# Patient Record
Sex: Male | Born: 1970 | Race: White | Hispanic: No | Marital: Married | Smoking: Former smoker
Health system: Southern US, Community
[De-identification: ages and names within clinical notes are randomized; demographics above are authoritative.]

## PROBLEM LIST (undated history)

## (undated) DIAGNOSIS — R7303 Prediabetes: Secondary | ICD-10-CM

## (undated) HISTORY — PX: APPENDECTOMY: SHX54

---

## 2015-11-06 ENCOUNTER — Other Ambulatory Visit: Payer: Self-pay | Admitting: Oncology

## 2016-04-14 ENCOUNTER — Inpatient Hospital Stay (HOSPITAL_COMMUNITY): Payer: Medicaid Other | Admitting: Anesthesiology

## 2016-04-14 ENCOUNTER — Emergency Department (HOSPITAL_COMMUNITY): Payer: Medicaid Other

## 2016-04-14 ENCOUNTER — Encounter (HOSPITAL_COMMUNITY): Payer: Self-pay | Admitting: Emergency Medicine

## 2016-04-14 ENCOUNTER — Inpatient Hospital Stay (HOSPITAL_COMMUNITY)
Admission: EM | Admit: 2016-04-14 | Discharge: 2016-05-03 | DRG: 330 | Disposition: A | Discharge status: Home / Self Care | Payer: Medicaid Other | Attending: Emergency Medicine | Admitting: Emergency Medicine

## 2016-04-14 ENCOUNTER — Encounter (HOSPITAL_COMMUNITY): Admission: EM | Disposition: A | Discharge status: Home / Self Care | Payer: Self-pay | Source: Home / Self Care

## 2016-04-14 DIAGNOSIS — B9620 Unspecified Escherichia coli [E. coli] as the cause of diseases classified elsewhere: Secondary | ICD-10-CM | POA: Diagnosis present

## 2016-04-14 DIAGNOSIS — Z9889 Other specified postprocedural states: Secondary | ICD-10-CM

## 2016-04-14 DIAGNOSIS — K567 Ileus, unspecified: Secondary | ICD-10-CM

## 2016-04-14 DIAGNOSIS — Q430 Meckel's diverticulum (displaced) (hypertrophic): Secondary | ICD-10-CM

## 2016-04-14 DIAGNOSIS — R739 Hyperglycemia, unspecified: Secondary | ICD-10-CM

## 2016-04-14 DIAGNOSIS — E781 Pure hyperglyceridemia: Secondary | ICD-10-CM | POA: Diagnosis present

## 2016-04-14 DIAGNOSIS — T8149XA Infection following a procedure, other surgical site, initial encounter: Secondary | ICD-10-CM

## 2016-04-14 DIAGNOSIS — R110 Nausea: Secondary | ICD-10-CM | POA: Diagnosis not present

## 2016-04-14 DIAGNOSIS — R197 Diarrhea, unspecified: Secondary | ICD-10-CM | POA: Diagnosis not present

## 2016-04-14 DIAGNOSIS — K352 Acute appendicitis with generalized peritonitis, without abscess: Secondary | ICD-10-CM

## 2016-04-14 DIAGNOSIS — R140 Abdominal distension (gaseous): Secondary | ICD-10-CM

## 2016-04-14 DIAGNOSIS — K913 Postprocedural intestinal obstruction, unspecified as to partial versus complete: Secondary | ICD-10-CM | POA: Diagnosis not present

## 2016-04-14 DIAGNOSIS — Z87891 Personal history of nicotine dependence: Secondary | ICD-10-CM | POA: Diagnosis not present

## 2016-04-14 DIAGNOSIS — K35209 Acute appendicitis with generalized peritonitis, without abscess, unspecified as to perforation: Secondary | ICD-10-CM

## 2016-04-14 DIAGNOSIS — K6811 Postprocedural retroperitoneal abscess: Secondary | ICD-10-CM | POA: Diagnosis not present

## 2016-04-14 DIAGNOSIS — K3532 Acute appendicitis with perforation and localized peritonitis, without abscess: Secondary | ICD-10-CM | POA: Diagnosis present

## 2016-04-14 DIAGNOSIS — T8143XA Infection following a procedure, organ and space surgical site, initial encounter: Secondary | ICD-10-CM

## 2016-04-14 DIAGNOSIS — K56609 Unspecified intestinal obstruction, unspecified as to partial versus complete obstruction: Secondary | ICD-10-CM

## 2016-04-14 DIAGNOSIS — Z09 Encounter for follow-up examination after completed treatment for conditions other than malignant neoplasm: Secondary | ICD-10-CM

## 2016-04-14 DIAGNOSIS — R7303 Prediabetes: Secondary | ICD-10-CM | POA: Diagnosis present

## 2016-04-14 DIAGNOSIS — K353 Acute appendicitis with localized peritonitis: Principal | ICD-10-CM | POA: Diagnosis present

## 2016-04-14 DIAGNOSIS — R1031 Right lower quadrant pain: Secondary | ICD-10-CM | POA: Diagnosis present

## 2016-04-14 DIAGNOSIS — R109 Unspecified abdominal pain: Secondary | ICD-10-CM

## 2016-04-14 DIAGNOSIS — D72829 Elevated white blood cell count, unspecified: Secondary | ICD-10-CM

## 2016-04-14 DIAGNOSIS — R112 Nausea with vomiting, unspecified: Secondary | ICD-10-CM

## 2016-04-14 HISTORY — PX: LAPAROSCOPIC APPENDECTOMY: SHX408

## 2016-04-14 HISTORY — DX: Prediabetes: R73.03

## 2016-04-14 LAB — COMPREHENSIVE METABOLIC PANEL
ALK PHOS: 74 U/L (ref 38–126)
ALT: 31 U/L (ref 17–63)
ANION GAP: 9 (ref 5–15)
AST: 27 U/L (ref 15–41)
Albumin: 4.7 g/dL (ref 3.5–5.0)
BUN: 15 mg/dL (ref 6–20)
CALCIUM: 9.3 mg/dL (ref 8.9–10.3)
CHLORIDE: 100 mmol/L — AB (ref 101–111)
CO2: 24 mmol/L (ref 22–32)
Creatinine, Ser: 1.19 mg/dL (ref 0.61–1.24)
Glucose, Bld: 200 mg/dL — ABNORMAL HIGH (ref 65–99)
Potassium: 4.3 mmol/L (ref 3.5–5.1)
SODIUM: 133 mmol/L — AB (ref 135–145)
Total Bilirubin: 2.3 mg/dL — ABNORMAL HIGH (ref 0.3–1.2)
Total Protein: 8 g/dL (ref 6.5–8.1)

## 2016-04-14 LAB — CBC
HCT: 47.8 % (ref 39.0–52.0)
HEMOGLOBIN: 16.8 g/dL (ref 13.0–17.0)
MCH: 32.2 pg (ref 26.0–34.0)
MCHC: 35.1 g/dL (ref 30.0–36.0)
MCV: 91.7 fL (ref 78.0–100.0)
PLATELETS: 319 10*3/uL (ref 150–400)
RBC: 5.21 MIL/uL (ref 4.22–5.81)
RDW: 12.7 % (ref 11.5–15.5)
WBC: 21.1 10*3/uL — AB (ref 4.0–10.5)

## 2016-04-14 LAB — LIPASE, BLOOD: LIPASE: 18 U/L (ref 11–51)

## 2016-04-14 LAB — CBG MONITORING, ED: Glucose-Capillary: 169 mg/dL — ABNORMAL HIGH (ref 65–99)

## 2016-04-14 LAB — I-STAT CG4 LACTIC ACID, ED: Lactic Acid, Venous: 1.1 mmol/L (ref 0.5–1.9)

## 2016-04-14 LAB — GLUCOSE, CAPILLARY
GLUCOSE-CAPILLARY: 180 mg/dL — AB (ref 65–99)
GLUCOSE-CAPILLARY: 156 mg/dL — AB (ref 65–99)

## 2016-04-14 SURGERY — APPENDECTOMY, LAPAROSCOPIC
Anesthesia: General | Site: Abdomen

## 2016-04-14 MED ORDER — PIPERACILLIN-TAZOBACTAM 3.375 G IVPB
INTRAVENOUS | Status: AC
  Filled 2016-04-14: qty 50

## 2016-04-14 MED ORDER — MIDAZOLAM HCL 5 MG/5ML IJ SOLN
INTRAMUSCULAR | Status: DC | PRN
  Administered 2016-04-14: 2 mg via INTRAVENOUS

## 2016-04-14 MED ORDER — ENOXAPARIN SODIUM 40 MG/0.4ML ~~LOC~~ SOLN: 40.0000 mg | SUBCUTANEOUS | Status: DC

## 2016-04-14 MED ORDER — HYDROMORPHONE HCL 1 MG/ML IJ SOLN
1.0000 mg | Freq: Once | INTRAMUSCULAR | Status: AC
Start: 2016-04-14 — End: 2016-04-14
  Administered 2016-04-14: 1 mg via INTRAVENOUS
  Filled 2016-04-14: qty 1

## 2016-04-14 MED ORDER — BUPIVACAINE HCL 0.25 % IJ SOLN
INTRAMUSCULAR | Status: DC | PRN
  Administered 2016-04-14: 50 mL

## 2016-04-14 MED ORDER — BUPIVACAINE HCL (PF) 0.25 % IJ SOLN
INTRAMUSCULAR | Status: AC
  Filled 2016-04-14: qty 60

## 2016-04-14 MED ORDER — ONDANSETRON HCL 4 MG/2ML IJ SOLN
4.0000 mg | Freq: Once | INTRAMUSCULAR | Status: AC
Start: 2016-04-14 — End: 2016-04-14
  Administered 2016-04-14: 4 mg via INTRAVENOUS
  Filled 2016-04-14: qty 2

## 2016-04-14 MED ORDER — PROPOFOL 10 MG/ML IV BOLUS
INTRAVENOUS | Status: DC | PRN
  Administered 2016-04-14: 150 mg via INTRAVENOUS

## 2016-04-14 MED ORDER — HYDROMORPHONE HCL 1 MG/ML IJ SOLN
0.5000 mg | INTRAMUSCULAR | Status: DC | PRN
  Administered 2016-04-14: 1 mg via INTRAVENOUS
  Administered 2016-04-15: 2 mg via INTRAVENOUS
  Administered 2016-04-15: 1 mg via INTRAVENOUS
  Filled 2016-04-14 (×2): qty 1
  Filled 2016-04-14: qty 2

## 2016-04-14 MED ORDER — ENOXAPARIN SODIUM 40 MG/0.4ML ~~LOC~~ SOLN
40.0000 mg | SUBCUTANEOUS | Status: DC
  Administered 2016-04-15 – 2016-04-24 (×10): 40 mg via SUBCUTANEOUS
  Filled 2016-04-14 (×9): qty 0.4

## 2016-04-14 MED ORDER — HYDRALAZINE HCL 20 MG/ML IJ SOLN: 10.0000 mg | INTRAMUSCULAR | Status: DC | PRN

## 2016-04-14 MED ORDER — PHENOL 1.4 % MT LIQD
2.0000 | OROMUCOSAL | Status: DC | PRN
  Filled 2016-04-14 (×2): qty 177

## 2016-04-14 MED ORDER — PHENYLEPHRINE HCL 10 MG/ML IJ SOLN
INTRAMUSCULAR | Status: DC | PRN
  Administered 2016-04-14: 80 ug via INTRAVENOUS

## 2016-04-14 MED ORDER — ROCURONIUM BROMIDE 50 MG/5ML IV SOSY
PREFILLED_SYRINGE | INTRAVENOUS | Status: AC
Start: 2016-04-14 — End: 2016-04-14
  Filled 2016-04-14: qty 5

## 2016-04-14 MED ORDER — LACTATED RINGERS IV BOLUS (SEPSIS)
1000.0000 mL | Freq: Once | INTRAVENOUS | Status: AC
  Administered 2016-04-14: 1000 mL via INTRAVENOUS

## 2016-04-14 MED ORDER — FENTANYL CITRATE (PF) 100 MCG/2ML IJ SOLN
INTRAMUSCULAR | Status: AC
  Filled 2016-04-14: qty 2

## 2016-04-14 MED ORDER — ACETAMINOPHEN 10 MG/ML IV SOLN
INTRAVENOUS | Status: AC
  Filled 2016-04-14: qty 100

## 2016-04-14 MED ORDER — OXYCODONE HCL 5 MG PO TABS: 5.0000 mg | ORAL_TABLET | ORAL | 0 refills | Status: DC | PRN

## 2016-04-14 MED ORDER — PROMETHAZINE HCL 25 MG/ML IJ SOLN: 6.2500 mg | INTRAMUSCULAR | Status: DC | PRN

## 2016-04-14 MED ORDER — SODIUM CHLORIDE 0.9 % IV SOLN: 3.0000 g | Freq: Once | INTRAVENOUS | Status: DC

## 2016-04-14 MED ORDER — SUCCINYLCHOLINE CHLORIDE 200 MG/10ML IV SOSY
PREFILLED_SYRINGE | INTRAVENOUS | Status: AC
  Filled 2016-04-14: qty 10

## 2016-04-14 MED ORDER — SIMETHICONE 80 MG PO CHEW: 160.0000 mg | CHEWABLE_TABLET | Freq: Four times a day (QID) | ORAL | Status: DC | PRN

## 2016-04-14 MED ORDER — MENTHOL 3 MG MT LOZG: 1.0000 | LOZENGE | OROMUCOSAL | Status: DC | PRN

## 2016-04-14 MED ORDER — ROCURONIUM BROMIDE 50 MG/5ML IV SOSY
PREFILLED_SYRINGE | INTRAVENOUS | Status: DC | PRN
Start: 2016-04-14 — End: 2016-04-14
  Administered 2016-04-14: 30 mg via INTRAVENOUS
  Administered 2016-04-14 (×6): 10 mg via INTRAVENOUS

## 2016-04-14 MED ORDER — SODIUM CHLORIDE 0.9 % IV BOLUS (SEPSIS)
1000.0000 mL | Freq: Once | INTRAVENOUS | Status: AC
  Administered 2016-04-14: 1000 mL via INTRAVENOUS

## 2016-04-14 MED ORDER — ALUM & MAG HYDROXIDE-SIMETH 200-200-20 MG/5ML PO SUSP
30.0000 mL | Freq: Four times a day (QID) | ORAL | Status: DC | PRN
  Administered 2016-04-21 – 2016-04-29 (×5): 30 mL via ORAL
  Filled 2016-04-14 (×6): qty 30

## 2016-04-14 MED ORDER — ONDANSETRON 4 MG PO TBDP: 4.0000 mg | ORAL_TABLET | Freq: Four times a day (QID) | ORAL | Status: DC | PRN

## 2016-04-14 MED ORDER — HYDROMORPHONE HCL 1 MG/ML IJ SOLN: 0.2500 mg | INTRAMUSCULAR | Status: DC | PRN

## 2016-04-14 MED ORDER — IOPAMIDOL (ISOVUE-300) INJECTION 61%
30.0000 mL | Freq: Once | INTRAVENOUS | Status: AC | PRN
  Administered 2016-04-14: 30 mL via ORAL

## 2016-04-14 MED ORDER — BUPIVACAINE LIPOSOME 1.3 % IJ SUSP
20.0000 mL | Freq: Once | INTRAMUSCULAR | Status: AC
  Administered 2016-04-14: 20 mL
  Filled 2016-04-14: qty 20

## 2016-04-14 MED ORDER — PROCHLORPERAZINE EDISYLATE 5 MG/ML IJ SOLN
5.0000 mg | INTRAMUSCULAR | Status: DC | PRN
  Administered 2016-04-18: 10 mg via INTRAVENOUS
  Administered 2016-04-19 – 2016-04-29 (×2): 5 mg via INTRAVENOUS
  Filled 2016-04-14 (×3): qty 2

## 2016-04-14 MED ORDER — SUGAMMADEX SODIUM 200 MG/2ML IV SOLN
INTRAVENOUS | Status: AC
  Filled 2016-04-14: qty 2

## 2016-04-14 MED ORDER — INSULIN ASPART 100 UNIT/ML ~~LOC~~ SOLN
0.0000 [IU] | SUBCUTANEOUS | Status: DC
Start: 2016-04-14 — End: 2016-04-16
  Administered 2016-04-15: 2 [IU] via SUBCUTANEOUS
  Administered 2016-04-15 (×2): 3 [IU] via SUBCUTANEOUS
  Administered 2016-04-15 (×2): 2 [IU] via SUBCUTANEOUS
  Administered 2016-04-15: 3 [IU] via SUBCUTANEOUS
  Administered 2016-04-16 (×3): 2 [IU] via SUBCUTANEOUS
  Filled 2016-04-14: qty 1

## 2016-04-14 MED ORDER — IOPAMIDOL (ISOVUE-300) INJECTION 61%
100.0000 mL | Freq: Once | INTRAVENOUS | Status: AC | PRN
  Administered 2016-04-14: 100 mL via INTRAVENOUS

## 2016-04-14 MED ORDER — LACTATED RINGERS IR SOLN
Status: DC | PRN
  Administered 2016-04-14: 12000 mL

## 2016-04-14 MED ORDER — PIPERACILLIN-TAZOBACTAM 3.375 G IVPB
3.3750 g | Freq: Three times a day (TID) | INTRAVENOUS | Status: AC
  Administered 2016-04-14 – 2016-04-19 (×14): 3.375 g via INTRAVENOUS
  Filled 2016-04-14 (×14): qty 50

## 2016-04-14 MED ORDER — SUCCINYLCHOLINE CHLORIDE 200 MG/10ML IV SOSY
PREFILLED_SYRINGE | INTRAVENOUS | Status: DC | PRN
  Administered 2016-04-14: 120 mg via INTRAVENOUS

## 2016-04-14 MED ORDER — METOCLOPRAMIDE HCL 5 MG/ML IJ SOLN: 5.0000 mg | Freq: Four times a day (QID) | INTRAMUSCULAR | Status: DC | PRN

## 2016-04-14 MED ORDER — ACETAMINOPHEN 10 MG/ML IV SOLN
1000.0000 mg | Freq: Once | INTRAVENOUS | Status: AC
  Administered 2016-04-14: 1000 mg via INTRAVENOUS

## 2016-04-14 MED ORDER — LIDOCAINE 2% (20 MG/ML) 5 ML SYRINGE
INTRAMUSCULAR | Status: AC
  Filled 2016-04-14: qty 5

## 2016-04-14 MED ORDER — LACTATED RINGERS IV SOLN
INTRAVENOUS | Status: DC
  Administered 2016-04-14 – 2016-04-19 (×10): via INTRAVENOUS
  Administered 2016-04-22: 85 mL/h via INTRAVENOUS

## 2016-04-14 MED ORDER — LIDOCAINE 2% (20 MG/ML) 5 ML SYRINGE
INTRAMUSCULAR | Status: DC | PRN
  Administered 2016-04-14: 80 mg via INTRAVENOUS

## 2016-04-14 MED ORDER — METOPROLOL TARTRATE 5 MG/5ML IV SOLN
5.0000 mg | Freq: Four times a day (QID) | INTRAVENOUS | Status: DC | PRN
  Administered 2016-04-23: 5 mg via INTRAVENOUS
  Filled 2016-04-14: qty 5

## 2016-04-14 MED ORDER — BISACODYL 10 MG RE SUPP: 10.0000 mg | Freq: Two times a day (BID) | RECTAL | Status: DC | PRN

## 2016-04-14 MED ORDER — HYDROMORPHONE HCL 1 MG/ML IJ SOLN
1.0000 mg | Freq: Once | INTRAMUSCULAR | Status: AC
  Administered 2016-04-14: 1 mg via INTRAVENOUS
  Filled 2016-04-14: qty 1

## 2016-04-14 MED ORDER — PIPERACILLIN-TAZOBACTAM 3.375 G IVPB
3.3750 g | Freq: Once | INTRAVENOUS | Status: AC
  Administered 2016-04-14: 3.375 g via INTRAVENOUS
  Filled 2016-04-14: qty 50

## 2016-04-14 MED ORDER — MAGIC MOUTHWASH
15.0000 mL | Freq: Four times a day (QID) | ORAL | Status: DC | PRN
  Filled 2016-04-14: qty 15

## 2016-04-14 MED ORDER — ACETAMINOPHEN 325 MG PO TABS: 650.0000 mg | ORAL_TABLET | Freq: Four times a day (QID) | ORAL | Status: DC | PRN

## 2016-04-14 MED ORDER — SODIUM CHLORIDE 0.9 % IV SOLN
INTRAVENOUS | Status: DC
  Filled 2016-04-14: qty 6

## 2016-04-14 MED ORDER — PROPOFOL 10 MG/ML IV BOLUS
INTRAVENOUS | Status: AC
  Filled 2016-04-14: qty 20

## 2016-04-14 MED ORDER — STERILE WATER FOR IRRIGATION IR SOLN
Status: DC | PRN
  Administered 2016-04-14: 1000 mL

## 2016-04-14 MED ORDER — DIPHENHYDRAMINE HCL 50 MG/ML IJ SOLN
12.5000 mg | Freq: Four times a day (QID) | INTRAMUSCULAR | Status: DC | PRN
  Administered 2016-04-18 – 2016-04-29 (×3): 12.5 mg via INTRAVENOUS
  Filled 2016-04-14 (×3): qty 1

## 2016-04-14 MED ORDER — SIMETHICONE 80 MG PO CHEW
80.0000 mg | CHEWABLE_TABLET | Freq: Four times a day (QID) | ORAL | Status: DC | PRN
  Administered 2016-04-26 – 2016-04-30 (×4): 80 mg via ORAL
  Filled 2016-04-14 (×5): qty 1

## 2016-04-14 MED ORDER — METHOCARBAMOL 1000 MG/10ML IJ SOLN
1000.0000 mg | Freq: Four times a day (QID) | INTRAVENOUS | Status: DC | PRN
  Filled 2016-04-14: qty 10

## 2016-04-14 MED ORDER — PSEUDOEPHEDRINE-GUAIFENESIN ER 120-1200 MG PO TB12: 1.0000 | ORAL_TABLET | Freq: Two times a day (BID) | ORAL | Status: DC | PRN

## 2016-04-14 MED ORDER — ONDANSETRON HCL 4 MG/2ML IJ SOLN
4.0000 mg | Freq: Four times a day (QID) | INTRAMUSCULAR | Status: DC | PRN
  Administered 2016-04-18 – 2016-04-23 (×3): 4 mg via INTRAVENOUS
  Filled 2016-04-14 (×3): qty 2

## 2016-04-14 MED ORDER — DIPHENHYDRAMINE HCL 12.5 MG/5ML PO ELIX: 12.5000 mg | ORAL_SOLUTION | Freq: Four times a day (QID) | ORAL | Status: DC | PRN

## 2016-04-14 MED ORDER — ONDANSETRON HCL 4 MG/2ML IJ SOLN
INTRAMUSCULAR | Status: AC
  Filled 2016-04-14: qty 2

## 2016-04-14 MED ORDER — ROCURONIUM BROMIDE 50 MG/5ML IV SOSY
PREFILLED_SYRINGE | INTRAVENOUS | Status: AC
  Filled 2016-04-14: qty 5

## 2016-04-14 MED ORDER — FENTANYL CITRATE (PF) 100 MCG/2ML IJ SOLN
INTRAMUSCULAR | Status: DC | PRN
  Administered 2016-04-14 (×4): 50 ug via INTRAVENOUS

## 2016-04-14 MED ORDER — MIDAZOLAM HCL 2 MG/2ML IJ SOLN
INTRAMUSCULAR | Status: AC
  Filled 2016-04-14: qty 2

## 2016-04-14 MED ORDER — NAPROXEN 500 MG PO TABS: 500.0000 mg | ORAL_TABLET | Freq: Two times a day (BID) | ORAL | 1 refills | Status: DC | PRN

## 2016-04-14 MED ORDER — ACETAMINOPHEN 650 MG RE SUPP
650.0000 mg | Freq: Four times a day (QID) | RECTAL | Status: DC | PRN
Start: 2016-04-14 — End: 2016-04-22

## 2016-04-14 MED ORDER — SIMETHICONE 80 MG PO CHEW: 40.0000 mg | CHEWABLE_TABLET | Freq: Four times a day (QID) | ORAL | Status: DC | PRN

## 2016-04-14 MED ORDER — ONDANSETRON HCL 4 MG/2ML IJ SOLN
INTRAMUSCULAR | Status: DC | PRN
  Administered 2016-04-14: 4 mg via INTRAVENOUS

## 2016-04-14 MED ORDER — LIP MEDEX EX OINT
1.0000 "application " | TOPICAL_OINTMENT | Freq: Two times a day (BID) | CUTANEOUS | Status: DC
  Administered 2016-04-14 – 2016-04-30 (×23): 1 via TOPICAL
  Filled 2016-04-14 (×2): qty 7

## 2016-04-14 MED ORDER — 0.9 % SODIUM CHLORIDE (POUR BTL) OPTIME
TOPICAL | Status: DC | PRN
  Administered 2016-04-14: 1000 mL

## 2016-04-14 MED ORDER — SUGAMMADEX SODIUM 200 MG/2ML IV SOLN
INTRAVENOUS | Status: DC | PRN
  Administered 2016-04-14: 200 mg via INTRAVENOUS

## 2016-04-14 SURGICAL SUPPLY — 59 items
APPLIER CLIP 5 13 M/L LIGAMAX5 (MISCELLANEOUS)
APPLIER CLIP ROT 10 11.4 M/L (STAPLE)
CABLE HIGH FREQUENCY MONO STRZ (ELECTRODE) ×3 IMPLANT
CHLORAPREP W/TINT 26ML (MISCELLANEOUS) ×3 IMPLANT
CLIP APPLIE 5 13 M/L LIGAMAX5 (MISCELLANEOUS) IMPLANT
CLIP APPLIE ROT 10 11.4 M/L (STAPLE) IMPLANT
COVER SURGICAL LIGHT HANDLE (MISCELLANEOUS) ×3 IMPLANT
CUTTER FLEX LINEAR 45M (STAPLE) IMPLANT
DECANTER SPIKE VIAL GLASS SM (MISCELLANEOUS) ×3 IMPLANT
DEVICE TROCAR PUNCTURE CLOSURE (ENDOMECHANICALS) IMPLANT
DRAPE LAPAROSCOPIC ABDOMINAL (DRAPES) ×3 IMPLANT
DRAPE WARM FLUID 44X44 (DRAPE) ×3 IMPLANT
DRSG TEGADERM 2-3/8X2-3/4 SM (GAUZE/BANDAGES/DRESSINGS) ×15 IMPLANT
DRSG TEGADERM 4X4.75 (GAUZE/BANDAGES/DRESSINGS) ×3 IMPLANT
DRSG VAC ATS SM SENSATRAC (GAUZE/BANDAGES/DRESSINGS) ×3 IMPLANT
ELECT REM PT RETURN 9FT ADLT (ELECTROSURGICAL) ×3
ELECTRODE REM PT RTRN 9FT ADLT (ELECTROSURGICAL) ×1 IMPLANT
ENDOLOOP SUT PDS II  0 18 (SUTURE)
ENDOLOOP SUT PDS II 0 18 (SUTURE) IMPLANT
GAUZE SPONGE 2X2 8PLY STRL LF (GAUZE/BANDAGES/DRESSINGS) ×2 IMPLANT
GLOVE ECLIPSE 8.0 STRL XLNG CF (GLOVE) ×3 IMPLANT
GLOVE INDICATOR 8.0 STRL GRN (GLOVE) ×3 IMPLANT
GOWN STRL REUS W/TWL XL LVL3 (GOWN DISPOSABLE) ×6 IMPLANT
HANDLE SUCTION POOLE (INSTRUMENTS) ×1 IMPLANT
IRRIG SUCT STRYKERFLOW 2 WTIP (MISCELLANEOUS) ×3
IRRIGATION SUCT STRKRFLW 2 WTP (MISCELLANEOUS) ×1 IMPLANT
KIT BASIN OR (CUSTOM PROCEDURE TRAY) ×3 IMPLANT
PAD POSITIONING PINK XL (MISCELLANEOUS) ×3 IMPLANT
PENCIL BUTTON HOLSTER BLD 10FT (ELECTRODE) ×3 IMPLANT
POUCH SPECIMEN RETRIEVAL 10MM (ENDOMECHANICALS) ×3 IMPLANT
RELOAD 45 VASCULAR/THIN (ENDOMECHANICALS) IMPLANT
RELOAD STAPLE TA45 3.5 REG BLU (ENDOMECHANICALS) IMPLANT
SCISSORS LAP 5X35 DISP (ENDOMECHANICALS) ×3 IMPLANT
SHEARS HARMONIC ACE PLUS 36CM (ENDOMECHANICALS) IMPLANT
SLEEVE XCEL OPT CAN 5 100 (ENDOMECHANICALS) ×6 IMPLANT
SPONGE GAUZE 2X2 STER 10/PKG (GAUZE/BANDAGES/DRESSINGS) ×4
STAPLER 90 3.5 STAND SLIM (STAPLE) ×3
STAPLER 90 3.5 STD SLIM (STAPLE) ×1 IMPLANT
STAPLER GUN LINEAR PROX 60 (STAPLE) ×3 IMPLANT
STAPLER PROXIMATE 75MM BLUE (STAPLE) ×3 IMPLANT
SUCTION POOLE HANDLE (INSTRUMENTS) ×3
SUT MNCRL AB 4-0 PS2 18 (SUTURE) ×3 IMPLANT
SUT PDS AB 0 CT1 36 (SUTURE) IMPLANT
SUT PDS AB 1 CT1 27 (SUTURE) IMPLANT
SUT PDS AB 1 CTX 36 (SUTURE) ×6 IMPLANT
SUT SILK 0 (SUTURE) ×2
SUT SILK 0 30XBRD TIE 6 (SUTURE) ×1 IMPLANT
SUT SILK 2 0 SH (SUTURE) IMPLANT
SUT SILK 2 0 SH CR/8 (SUTURE) ×3 IMPLANT
SUT SILK 3 0 SH CR/8 (SUTURE) ×3 IMPLANT
SYS LAPSCP GELPORT 120MM (MISCELLANEOUS) ×3
SYSTEM LAPSCP GELPORT 120MM (MISCELLANEOUS) ×1 IMPLANT
TOWEL OR 17X26 10 PK STRL BLUE (TOWEL DISPOSABLE) ×3 IMPLANT
TRAY FOLEY W/METER SILVER 16FR (SET/KITS/TRAYS/PACK) IMPLANT
TRAY LAPAROSCOPIC (CUSTOM PROCEDURE TRAY) ×3 IMPLANT
TROCAR BLADELESS OPT 5 100 (ENDOMECHANICALS) ×3 IMPLANT
TROCAR XCEL 12X100 BLDLESS (ENDOMECHANICALS) ×3 IMPLANT
TUBING INSUF HEATED (TUBING) ×3 IMPLANT
YANKAUER SUCT BULB TIP 10FT TU (MISCELLANEOUS) ×3 IMPLANT

## 2016-04-14 NOTE — ED Provider Notes (Signed)
Patient care is assumed from Dr. Effie ShyWentz at shift change. I have examined the patient. He is alert with clear mental status and no respiratory distress. Abdominal pain is concentrated to right lower quadrant. At this time, he does not have diffuse peritoneal signs. Heart rate is 107. Blood pressures are stable. Patient denies any need for additional pain medication at this time.  Consult: Dr. Michaell CowingGross. Case is reviewed at 15:30. He will see the patient for a call evaluation.  I have changed the patient's antibiotic from Unasyn to Zosyn as it had not yet been administered at 15:31.    Arby BarretteMarcy Lakyla Biswas, MD 04/14/16 [phone]

## 2016-04-14 NOTE — ED Notes (Signed)
Patient reports that he is not a diabetic and would like to hold off on the insulin. States that illness could be causing the increase in sugar level.

## 2016-04-14 NOTE — Anesthesia Preprocedure Evaluation (Signed)
Anesthesia Evaluation  Patient identified by MRN, date of birth, ID band Patient awake    Reviewed: Allergy & Precautions, NPO status , Patient's Chart, lab work & pertinent test results  Airway Mallampati: II  TM Distance: >3 FB Neck ROM: Full    Dental  (+) Dental Advisory Given   Pulmonary former smoker,    breath sounds clear to auscultation       Cardiovascular (-) hypertension(-) angina Rhythm:Regular Rate:Normal     Neuro/Psych neg Seizures    GI/Hepatic Neg liver ROS, Acute appendicitis   Endo/Other  negative endocrine ROS  Renal/GU negative Renal ROS     Musculoskeletal   Abdominal   Peds  Hematology negative hematology ROS (+)   Anesthesia Other Findings   Reproductive/Obstetrics                             Lab Results  Component Value Date   WBC 21.1 (H) 04/14/2016   HGB 16.8 04/14/2016   HCT 47.8 04/14/2016   MCV 91.7 04/14/2016   PLT 319 04/14/2016   Lab Results  Component Value Date   CREATININE 1.19 04/14/2016   BUN 15 04/14/2016   NA 133 (L) 04/14/2016   K 4.3 04/14/2016   CL 100 (L) 04/14/2016   CO2 24 04/14/2016    Anesthesia Physical Anesthesia Plan  ASA: II and emergent  Anesthesia Plan: General   Post-op Pain Management:    Induction: Intravenous and Rapid sequence  Airway Management Planned: Oral ETT  Additional Equipment:   Intra-op Plan:   Post-operative Plan: Extubation in OR  Informed Consent: I have reviewed the patients History and Physical, chart, labs and discussed the procedure including the risks, benefits and alternatives for the proposed anesthesia with the patient or authorized representative who has indicated his/her understanding and acceptance.   Dental advisory given  Plan Discussed with: CRNA  Anesthesia Plan Comments:         Anesthesia Quick Evaluation

## 2016-04-14 NOTE — ED Notes (Signed)
CHG bath completed  

## 2016-04-14 NOTE — ED Notes (Signed)
Pt attempting to provide urine specimen

## 2016-04-14 NOTE — H&P (Signed)
[facility]  [address] [place], [place] [address] Phone: [phone] FAX: [facility]  Oct 17, 1970 [id number]  CARE TEAM:  PCP: No PCP Per Patient  Outpatient Care Team: Patient Care Team: No Pcp Per Patient as PCP - General (General Practice) Michael Boston, MD as Consulting Physician (General Surgery)  Inpatient Treatment Team: Treatment Team: Attending Provider: Nolon Nations, MD; Registered Nurse: Caren Griffins, RN; Physician Assistant: Montine Circle, PA-C; Technician: Ashok Norris, NT; Rounding Team: Md Edison Pace, MD  This patient is a 45 y.o.male who presents today for surgical evaluation at the request of Dr. Johnney Killian.   Reason for evaluation: Appendicitis  45 year old with abdominal pain starting yesterday.  Intensified and more focal in the right lower quadrant.  Worsen.  [facility] emergency room.  Pain becoming more diffuse again.  No prior abdominal surgeries.  Not really on any medications.  Glucose 200.  Do not know if it has been treated.  Exam suspicious for appendicitis.  CT scan shows appendicitis with focal perforation.  Surgical consult requested.  (Apparently prior ER doctor could not get a hold of me the past two hours although I was getting pages elsewhere including new consults).  No personal nor family history of GI/colon cancer, inflammatory bowel disease, irritable bowel syndrome, allergy such as Celiac Sprue, dietary/dairy problems, colitis, ulcers nor gastritis.  No recent sick contacts/gastroenteritis.  No travel outside the country.  No changes in diet.  No dysphagia to solids or liquids.  No significant heartburn or reflux.  No hematochezia, hematemesis, coffee ground emesis.  No evidence of prior gastric/peptic ulceration.  He used to smoke but quit a month ago.  Does not drink alcohol too often.  He can walk 10 miles without difficulty.  He comes today with his wife and mother.    History  reviewed. No pertinent past medical history.  History reviewed. No pertinent surgical history.  Social History   Social History  . Marital status: Married    Spouse name: N/A  . Number of children: N/A  . Years of education: N/A   Occupational History  . Not on file.   Social History Main Topics  . Smoking status: Former Research scientist (life sciences)  . Smokeless tobacco: Never Used  . Alcohol use No  . Drug use: Unknown  . Sexual activity: Not on file   Other Topics Concern  . Not on file   Social History Narrative  . No narrative on file    No family history on file.  Current Facility-Administered Medications  Medication Dose Route Frequency Provider Last Rate Last Dose  . acetaminophen (TYLENOL) tablet 650 mg  650 mg Oral Q6H PRN Michael Boston, MD       Or  . acetaminophen (TYLENOL) suppository 650 mg  650 mg Rectal Q6H PRN Michael Boston, MD      . diphenhydrAMINE (BENADRYL) 12.5 MG/5ML elixir 12.5 mg  12.5 mg Oral Q6H PRN Michael Boston, MD       Or  . diphenhydrAMINE (BENADRYL) injection 12.5 mg  12.5 mg Intravenous Q6H PRN Michael Boston, MD      . enoxaparin (LOVENOX) injection 40 mg  40 mg Subcutaneous Q24H Michael Boston, MD      . hydrALAZINE (APRESOLINE) injection 10 mg  10 mg Intravenous Q2H PRN Michael Boston, MD      . insulin aspart (novoLOG) injection 0-15 Units  0-15 Units Subcutaneous Q4H Michael Boston, MD      .  lactated ringers bolus 1,000 mL  1,000 mL Intravenous Once Michael Boston, MD      . lactated ringers infusion   Intravenous Continuous Michael Boston, MD      . ondansetron (ZOFRAN-ODT) disintegrating tablet 4 mg  4 mg Oral Q6H PRN Michael Boston, MD       Or  . ondansetron [facility]) injection 4 mg  4 mg Intravenous Q6H PRN Michael Boston, MD      . piperacillin-tazobactam (ZOSYN) IVPB 3.375 g  3.375 g Intravenous Once Charlesetta Shanks, MD      . piperacillin-tazobactam (ZOSYN) IVPB 3.375 g  3.375 g Intravenous Q8H Michael Boston, MD      . Pseudoephedrine-Guaifenesin [phone] MG TB12 1  tablet  1 tablet Oral BID PRN Michael Boston, MD      . simethicone [facility]) chewable tablet 80 mg  80 mg Oral Q6H PRN Michael Boston, MD      . sodium chloride 0.9 % bolus 1,000 mL  1,000 mL Intravenous Once Daleen Bo, MD       Current Outpatient Prescriptions  Medication Sig Dispense Refill  . Pseudoephedrine-Guaifenesin (MUCINEX D MAX STRENGTH) [phone] MG TB12 Take 1 tablet by mouth 2 (two) times daily as needed (for cough/congestion).    . simethicone (MYLICON) 80 MG chewable tablet Chew 160 mg by mouth every 6 (six) hours as needed for flatulence.       No Known Allergies  ROS: Constitutional:  No fevers, chills, sweats.  Weight stable Eyes:  No vision changes, No discharge HENT:  No sore throats, nasal drainage Lymph: No neck swelling, No bruising easily Pulmonary:  No cough, productive sputum CV: No orthopnea, PND  Patient walks 3 HOURS minutes for about 10 miles without difficulty.  No exertional chest/neck/shoulder/arm pain. GI: No personal nor family history of GI/colon cancer, inflammatory bowel disease, irritable bowel syndrome, allergy such as Celiac Sprue, dietary/dairy problems, colitis, ulcers nor gastritis.  No recent sick contacts/gastroenteritis.  No travel outside the country.  No changes in diet. Renal: No UTIs, No hematuria Genital:  No drainage, bleeding, masses Musculoskeletal: No severe joint pain.  Good ROM major joints Skin:  No sores or lesions.  No rashes Heme/Lymph:  No easy bleeding.  No swollen lymph nodes Neuro: No focal weakness/numbness.  No seizures Psych: No suicidal ideation.  No hallucinations  BP 124/74 (BP Location: Left Arm)   Pulse 105   Temp 100.2 F (37.9 C) (Oral)   Resp 18   SpO2 90%   Physical Exam: General: Pt awake/alert/oriented x4 in moderate distress Eyes: PERRL, normal EOM. Sclera nonicteric Neuro: CN II-XII intact w/o focal sensory/motor deficits. Lymph: No head/neck/groin lymphadenopathy Psych:  No  delerium/psychosis/paranoia HENT: Normocephalic, Mucus membranes moist.  No thrush Neck: Supple, No tracheal deviation Chest: No pain.  Good respiratory excursion. CV:  Pulses intact.  Regular rhythm Abdomen: Firm.  Moderately distended.  TTP especially over McBurney's point and right lower quadrant.  Focal peritonitis. Genital: No inguinal hernias.  No lymphadenopathy.  Normal external genitalia. Ext:  SCDs BLE.  No significant edema.  No cyanosis Skin: No petechiae / purpurea.  No major sores Musculoskeletal: No severe joint pain.  Good ROM major joints   Results:   Labs: Results for orders placed or performed during the hospital encounter of 04/14/16 (from the past 48 hour(s))  Lipase, blood     Status: None   Collection Time: 04/14/16 11:05 AM  Result Value Ref Range   Lipase 18 11 - 51 U/L  Comprehensive  metabolic panel     Status: Abnormal   Collection Time: 04/14/16 11:05 AM  Result Value Ref Range   Sodium 133 (L) 135 - 145 mmol/L   Potassium 4.3 3.5 - 5.1 mmol/L   Chloride 100 (L) 101 - 111 mmol/L   CO2 24 22 - 32 mmol/L   Glucose, Bld 200 (H) 65 - 99 mg/dL   BUN 15 6 - 20 mg/dL   Creatinine, Ser 1.19 0.61 - 1.24 mg/dL   Calcium 9.3 8.9 - 10.3 mg/dL   Total Protein 8.0 6.5 - 8.1 g/dL   Albumin 4.7 3.5 - 5.0 g/dL   AST 27 15 - 41 U/L   ALT 31 17 - 63 U/L   Alkaline Phosphatase 74 38 - 126 U/L   Total Bilirubin 2.3 (H) 0.3 - 1.2 mg/dL   GFR calc non Af Amer >60 >60 mL/min   GFR calc Af Amer >60 >60 mL/min    Comment: (NOTE) The eGFR has been calculated using the CKD EPI equation. This calculation has not been validated in all clinical situations. eGFR's persistently <60 mL/min signify possible Chronic Kidney Disease.    Anion gap 9 5 - 15  CBC     Status: Abnormal   Collection Time: 04/14/16 11:05 AM  Result Value Ref Range   WBC 21.1 (H) 4.0 - 10.5 K/uL   RBC 5.21 4.22 - 5.81 MIL/uL   Hemoglobin 16.8 13.0 - 17.0 g/dL   HCT 47.8 39.0 - 52.0 %   MCV 91.7  78.0 - 100.0 fL   MCH 32.2 26.0 - 34.0 pg   MCHC 35.1 30.0 - 36.0 g/dL   RDW 12.7 11.5 - 15.5 %   Platelets 319 150 - 400 K/uL    Imaging / Studies: Ct Abdomen Pelvis W Contrast  Result Date: 04/14/2016 CLINICAL DATA:  rlq pain started today eval for appy^150m ISOVUE-300 IOPAMIDOL (ISOVUE-300) INJECTION 61%, 367mISOVUE-300 IOPAMIDOL (ISOVUE-300) INJECTION 61% EXAM: CT ABDOMEN AND PELVIS WITH CONTRAST TECHNIQUE: Multidetector CT imaging of the abdomen and pelvis was performed using the standard protocol following bolus administration of intravenous contrast. CONTRAST:  10096mSOVUE-300 IOPAMIDOL (ISOVUE-300) INJECTION 61%, 36m68mOVUE-300 IOPAMIDOL (ISOVUE-300) INJECTION 61% COMPARISON:  None. FINDINGS: Lower chest: Lung bases are clear. Hepatobiliary: No focal hepatic lesion. Tiny gallstones within lumen gallbladder. No gallbladder distension. Pancreas: Pancreas is normal. No ductal dilatation. No pancreatic inflammation. Spleen: Normal spleen Adrenals/urinary tract: Adrenal glands and kidneys are normal. The ureters and bladder normal. Stomach/Bowel: Small hiatal hernia. Stomach, duodenum small bowel are normal. The cecum is positioned high at the level of the RIGHT hepatic lobe. There is extensive inflammation involving the cecum. The appendix is distended to 10 mm in diameter. There is an at appendicolith at the orifice of the appendix (image 90). There is a small gas collection positioned between the tip of the appendix and the cecum measuring 2.5 by 1.8 by 4.3 cm. This finding is most consistent with a perforated acute appendicitis. The appendix extends in a retrocecal fashion towards the inferior margin the RIGHT hepatic lobe. There is scattered small foci of intraperitoneal free air in the RIGHT upper quadrant (image 35, series 3 and on image 52, series 2). Small amount fluid along the pericolic gutter. Remainder of the colon is normal. Vascular/Lymphatic: Abdominal aorta is normal caliber. There  is no retroperitoneal or periportal lymphadenopathy. No pelvic lymphadenopathy. Reproductive: Prostate normal Other: Smaller free fluid along the RIGHT pericolic gutter Musculoskeletal: No aggressive osseous lesion. IMPRESSION: 1. Findings consistent with  acute perforated appendicitis. 2. Small gas collection positioned between the cecum and the appendix and scattered intraperitoneal free air in the RIGHT abdomen. 3. No abscess formation. 4. Cecum is positioned high at the inferior margin the RIGHT hepatic lobe. 5. Small nonobstructing gallstone. Findings conveyed toROBERT Trujillo on 04/14/2016  at13:06. Electronically Signed   By: Suzy Bouchard M.D.   On: 04/14/2016 13:07    Medications / Allergies: per chart  Antibiotics: Anti-infectives    Start     Dose/Rate Route Frequency Ordered Stop   04/14/16 1545  piperacillin-tazobactam (ZOSYN) IVPB 3.375 g     3.375 g 12.5 mL/hr over 240 Minutes Intravenous Every 8 hours 04/14/16 1533     04/14/16 1530  piperacillin-tazobactam (ZOSYN) IVPB 3.375 g     3.375 g 12.5 mL/hr over 240 Minutes Intravenous  Once 04/14/16 1529     04/14/16 1515  Ampicillin-Sulbactam (UNASYN) 3 g in sodium chloride 0.9 % 100 mL IVPB  Status:  Discontinued     3 g 200 mL/hr over 30 Minutes Intravenous  Once 04/14/16 1514 04/14/16 1529   04/14/16 1515  Ampicillin-Sulbactam (UNASYN) 3 g in sodium chloride 0.9 % 100 mL IVPB  Status:  Discontinued     3 g 200 mL/hr over 30 Minutes Intravenous  Once 04/14/16 1514 04/14/16 1529      Assessment  Aaron Trujillo  44 y.o. male       Problem List:  Active Problems:   Perforated appendicitis   Hyperglycemia   Perforated appendicitis  Plan:  Admit.  IV fluids.  IV ABx.  Diagnostic laparoscopy with appendectomy.  Risks increased with perforation and free air, but short of time of Sx ~24hour & no abscess = doable.  I think best way to control this is with surgery first.  Start out laparoscopically.  The anatomy &  physiology of the digestive tract was discussed.  The pathophysiology of appendicitis and other appendiceal disorders were discussed.  Natural history risks without surgery was discussed.   I feel the risks of no intervention will lead to serious problems that outweigh the operative risks; therefore, I recommended diagnostic laparoscopy with removal of appendix to remove the pathology.  Laparoscopic & open techniques were discussed.   I noted a good likelihood this will help address the problem.   Risks such as bleeding, infection, abscess, leak, reoperation, injury to other organs, need for repair of tissues / organs, possible ostomy, hernia, heart attack, stroke, death, and other risks were discussed.  Goals of post-operative recovery were discussed as well.  We will work to minimize complications.  Questions were answered.  The patient expresses understanding & wishes to proceed with surgery.  Patient's family agrees as well.  RN in room.  Operating room aware  -Elevated glucose of uncertain etiology.  Possibly new diagnosis of diabetes.  We will check an A1c in the morning.  Place on insulin sliding scale. -VTE prophylaxis- SCDs, etc -mobilize as tolerated to help recovery    Adin Hector, M.D., F.A.C.S. Gastrointestinal and Minimally Invasive Surgery [facility] [address] [place], [place] [address] [phone] Main / Paging   04/14/2016  Note: Portions of this report may have been transcribed using voice recognition software. Every effort was made to ensure accuracy; however, inadvertent computerized transcription errors may be present.   Any transcriptional errors that result from this process are unintentional.

## 2016-04-14 NOTE — Op Note (Addendum)
7:47 PM  04/14/2016  PATIENT:  Aaron Trujillo  45 y.o. male  Patient Care Team: No Pcp Per Patient as PCP - General (General Practice) Karie SodaSteven Leane Loring, MD as Consulting Physician (General Surgery)  PRE-OPERATIVE DIAGNOSIS:  perforated appendix  POST-OPERATIVE DIAGNOSIS:    Gangrenous appendicitis with perforation & focal peritonitis Meckel's Diverticulum  PROCEDURE:    Lap-assisted ileocolonic resection Omentopexy of ileocolonic anastomosis Meckel's diverticulectomy  SURGEON:  Surgeon(s): Karie SodaSteven Shloimy Michalski, MD  ANESTHESIA:   local and general  EBL:  Total I/O In: 500 [I.V.:500] Out: -   Delay start of Pharmacological VTE agent (>24hrs) due to surgical blood loss or risk of bleeding:  no  DRAINS: none   SPECIMENS:    1.  Right colon with ileum & perforated appendix 2.  Meckel's diverticulum  DISPOSITION OF SPECIMEN:  PATHOLOGY  COUNTS:  YES  PLAN OF CARE: Admit to inpatient   PATIENT DISPOSITION:  PACU - hemodynamically stable.   INDICATIONS: Patient with concerning symptoms & work up suspicious for appendicitis.  Surgery was recommended:  The anatomy & physiology of the digestive tract was discussed.  The pathophysiology of appendicitis was discussed.  Natural history risks without surgery was discussed.   I feel the risks of no intervention will lead to serious problems that outweigh the operative risks; therefore, I recommended diagnostic laparoscopy with removal of appendix to remove the pathology.  Laparoscopic & open techniques were discussed.   I noted a good likelihood this will help address the problem.    Risks such as bleeding, infection, abscess, leak, reoperation, possible ostomy, hernia, heart attack, death, and other risks were discussed.  Goals of post-operative recovery were discussed as well.  We will work to minimize complications.  Questions were answered.  The patient expresses understanding & wishes to proceed with surgery.  OR FINDINGS:  Retrocecal appendix.  Gangrenous with perforation.  Right-sided abdominal peritonitis.  Meckel's diverticulum at mid ileum with atypical tip appearance.  Resected.  DESCRIPTION:   The patient was identified & brought into the operating room. The patient was positioned supine with arms tucked. SCDs were active during the entire case. The patient underwent general anesthesia without any difficulty.  The abdomen was prepped and draped in a sterile fashion. A Surgical Timeout confirmed our plan.  I made a transverse incision through the superior umbilical fold.  I made a small transverse nick through the infraumbilical fascia and confirmed peritoneal entry.  I placed a 5mm port.  We induced carbon dioxide insufflation.  Camera inspection revealed no injury.  I placed additional ports under direct laparoscopic visualization.  Patient had peritonitis involving the right side of his abdomen.  I mobilized the greater omentum.  At shortened thickened small bowel mesentery.  Mobilized the small bowel and terminal ileum in a lateral to medial fashion.  The patient was fair.  Eventually is able to get towards the hepatic flexure to findings gangrenous retrocecal appendix.  Cannot mobilize as well.  Therefore an side to mobilize the hepatic flexure and a superior to inferior fashion.  Made a window through the greater omentum and the proximal transverse colon.  I freed the greater omentum more proximally.  Work to mobilize the hepatic flexure of the colon off the retroperitoneum.  However a lot of tension and inflammation.  Cannot see well and was not coming up well.  I did copious irrigation of over 3 L of crystalloid.  That helped with some of the peritonitis and inflammation.  However visualization still was  challenging.  Therefore I placed a hand port through a upper midline incision.  With that I could better mobilize the terminal ileum and right colon in a lateral-to-medial fashion as well as to a  superior-to-inferior fashion.  Eventually was able to exteriorize that region.  Had some thinned out inflamed hepatic flexure colon.  The appendix was very thickened and gangrenous.  Cecum was foreshortened and not well visualized.  I did not feel that it was safe to just do an appendectomy.  Therefore proceeded with ileocolonic resection.  I transected the ileocolonic pedicle with clamps and 2-0 silk ties on the retroperitoneal side and harmonic dissection on the specimen side.  Went proximally to less inflamed ileum about 8 cm proximal to the  cecum.  I then took the hepatic flexure with clamp pedicle towards a mid transverse colon that was not nearly as inflamed and was soft.  Because there was no significant bleeding, no shock, not on pressors, and not a smoker; I proceeded with ileocolonic resection with anastomosis.  I did a side to side staple anastomosis from ileum to mid transverse colon with a 75 GIA stapler.  Stapled off the common defect with a TX 90 stapler.  Resected the remaining mesentery.  This help resect the hepatic flexure of the colon that was inflamed with some serosal breakdown.  I returned the anastomosis in the abdomen.  I changed gloves.  I did copious irrigation of another 9 L of crystalloid solution.  I ran the small bowel.  I found a Meckel's diverticulum in the mid ileum..  It did have sort of a smooth apex but was rather long.  Some irregularity at the tip.  I was concerned for some irregularity and possible nodularity.  I resected it off with a TX 60 stapler on the antimesenteric side.  I ensured hemostasis on the staple line with interrupted 2-0 silk suture.  I ran the small bowel.  No evidence of serosal injury nor other abnormality.  Allowed to return into the abdomen.  I did a final irrigation of antibiotic solution (clindamycin/gentamicin) and allowed that to rest in place while I reinspected the abdomen one final time.  I reinspected staple line of ileum at the Meckel's  diverticulectomy.  Tissues viable.   I reinspected the ileocolonic anastomosis.  Area looked viable.  I placed a crotch stitch with 2-0 silk suture.  I mobilized greater omentum over the anastomosis and tacked it down over the staple line using interrupted silk suture as an omentopexy.   Hemostasis good.  The rest omentum to return into the abdomen.    I removed the ports.  I changed gloves.  I closed the 12 mm port fascia site using a 0 Vicryl stitch. I closed skin  at the port sites using 4-0 monocryl stitch.  I closed the midline wound with #1 PDS in a running fashion from each corner.  Due to field block at the major anastomosis and the 12 m port site with bupivacaine and Experel.  I left the subcutaneous tissues of the midline wound open & placed a wound VAC over it.    Patient was extubated and sent to the recovery room. I discussed operative findings, updated the patient's status, discussed probable steps to recovery, and gave postoperative recommendations to the patient's family.  Recommendations were made.  Questions were answered.  They expressed understanding & appreciation.   Ardeth SportsmanSteven C. Savhanna Sliva, M.D., F.A.C.S. Gastrointestinal and Minimally Invasive Surgery [facility] [address] [place], [place] [address] [phone] Main / Paging

## 2016-04-14 NOTE — ED Notes (Signed)
Pt unable to provide urine specimen.  

## 2016-04-14 NOTE — Anesthesia Procedure Notes (Signed)
Procedure Name: Intubation Date/Time: 04/14/2016 4:48 PM Performed by: Delphia GratesHANDLER, Antoni Stefan Pre-anesthesia Checklist: Emergency Drugs available, Suction available, Patient being monitored and Patient identified Patient Re-evaluated:Patient Re-evaluated prior to inductionOxygen Delivery Method: Circle system utilized Preoxygenation: Pre-oxygenation with 100% oxygen Intubation Type: IV induction, Rapid sequence and Cricoid Pressure applied Laryngoscope Size: Mac and 4 Grade View: Grade I Tube type: Oral Tube size: 7.5 mm Number of attempts: 1 Airway Equipment and Method: Stylet Placement Confirmation: ETT inserted through vocal cords under direct vision,  CO2 detector and breath sounds checked- equal and bilateral Secured at: 22 cm Tube secured with: Tape Dental Injury: Teeth and Oropharynx as per pre-operative assessment

## 2016-04-14 NOTE — ED Provider Notes (Signed)
  Face-to-face evaluation   History: Presents for evaluation of abdominal pain for 2 days, initially was primary umbilical not his radiated to the right side, mostly upper. His nausea and vomiting has not been able to eat or drink. No weakness or dizziness.  Physical exam: Alert, cooperative, uncomfortable. Abdomen with moderate tenderness right upper lower quadrants, with guarding. No rebound tenderness.  Medical screening examination/treatment/procedure(s) were conducted as a shared visit with non-physician practitioner(s) and myself.  I personally evaluated the patient during the encounter   15:15- Still waiting on call from Dr. America BrownGross/General Surgery. IVF and IV ABX ordered. Dr. Orlene PlumPfeifffer will contact surgery.   Mancel BaleElliott Juliauna Stueve, MD 04/15/16 [phone]

## 2016-04-14 NOTE — Anesthesia Postprocedure Evaluation (Signed)
Anesthesia Post Note  Patient: Aaron Trujillo  Procedure(s) Performed: Procedure(s) (LRB): ileocolonic resection with Meckel's diverticulectomy (N/A)  Patient location during evaluation: PACU Anesthesia Type: General Level of consciousness: awake and alert Pain management: pain level controlled Vital Signs Assessment: post-procedure vital signs reviewed and stable Respiratory status: spontaneous breathing, nonlabored ventilation, respiratory function stable and patient connected to nasal cannula oxygen Cardiovascular status: blood pressure returned to baseline and stable Postop Assessment: no signs of nausea or vomiting Anesthetic complications: no    Last Vitals:  Vitals:   04/14/16 2000 04/14/16 2015  BP: (!) 145/104 (!) 135/98  Pulse: 97 93  Resp: (!) 21 19  Temp: 36.5 C     Last Pain:  Vitals:   04/14/16 1543  TempSrc:   PainSc: 2                  Kennieth RadFitzgerald, Dyanna Seiter E

## 2016-04-14 NOTE — ED Provider Notes (Signed)
[facility]-EMERGENCY DEPT Provider Note   CSN: [id number] Arrival date & time: 04/14/16  1029     History   Chief Complaint Chief Complaint  Patient presents with  . Abdominal Pain    HPI Aaron Trujillo is a 45 y.o. male.  Patient presents to the ED with a chief complaint of RLQ abdominal pain.  He states that the symptoms started yesterday and have gradually worsened.  He states that the pain started in the center of his abdomen and the moved to his right lower abdomen and now have moved back to the center.  He reports associated fevers, chills, and nausea, but without vomiting.  There are no other associated symptoms.  He denies any prior abdominal surgeries.  He has not taken anything for his symptoms.   The history is provided by the patient. No language interpreter was used.    History reviewed. No pertinent past medical history.  There are no active problems to display for this patient.   History reviewed. No pertinent surgical history.     Home Medications    Prior to Admission medications   Not on File    Family History No family history on file.  Social History Social History  Substance Use Topics  . Smoking status: Former Games developer  . Smokeless tobacco: Never Used  . Alcohol use No     Allergies   Patient has no allergy information on record.   Review of Systems Review of Systems  Constitutional: Positive for chills and fever.  Gastrointestinal: Positive for abdominal pain and nausea.  All other systems reviewed and are negative.    Physical Exam Updated Vital Signs BP 146/91 (BP Location: Left Arm)   Pulse 90   Temp 98.2 F (36.8 C) (Oral)   Resp 16   SpO2 98%   Physical Exam  Constitutional: He is oriented to person, place, and time. He appears well-developed and well-nourished.  HENT:  Head: Normocephalic and atraumatic.  Eyes: Conjunctivae and EOM are normal. Pupils are equal, round, and reactive to light. Right eye exhibits no  discharge. Left eye exhibits no discharge. No scleral icterus.  Neck: Normal range of motion. Neck supple. No JVD present.  Cardiovascular: Normal rate, regular rhythm and normal heart sounds.  Exam reveals no gallop and no friction rub.   No murmur heard. Pulmonary/Chest: Effort normal and breath sounds normal. No respiratory distress. He has no wheezes. He has no rales. He exhibits no tenderness.  Abdominal: Soft. He exhibits no distension and no mass. There is tenderness. There is no rebound and no guarding.  RLQ is focally tender to palpation, with diffuse generalized abdominal discomfort as well  Musculoskeletal: Normal range of motion. He exhibits no edema or tenderness.  Neurological: He is alert and oriented to person, place, and time.  Skin: Skin is warm and dry.  Psychiatric: He has a normal mood and affect. His behavior is normal. Judgment and thought content normal.  Nursing note and vitals reviewed.    ED Treatments / Results  Labs (all labs ordered are listed, but only abnormal results are displayed) Labs Reviewed  COMPREHENSIVE METABOLIC PANEL - Abnormal; Notable for the following:       Result Value   Sodium 133 (*)    Chloride 100 (*)    Glucose, Bld 200 (*)    Total Bilirubin 2.3 (*)    All other components within normal limits  CBC - Abnormal; Notable for the following:    WBC 21.1 (*)  All other components within normal limits  LIPASE, BLOOD  URINALYSIS, ROUTINE W REFLEX MICROSCOPIC (NOT AT [facility])    EKG  EKG Interpretation None       Radiology Ct Abdomen Pelvis W Contrast  Result Date: 04/14/2016 CLINICAL DATA:  rlq pain started today eval for appy^16500mL ISOVUE-300 IOPAMIDOL (ISOVUE-300) INJECTION 61%, 30mL ISOVUE-300 IOPAMIDOL (ISOVUE-300) INJECTION 61% EXAM: CT ABDOMEN AND PELVIS WITH CONTRAST TECHNIQUE: Multidetector CT imaging of the abdomen and pelvis was performed using the standard protocol following bolus administration of intravenous  contrast. CONTRAST:  100mL ISOVUE-300 IOPAMIDOL (ISOVUE-300) INJECTION 61%, 30mL ISOVUE-300 IOPAMIDOL (ISOVUE-300) INJECTION 61% COMPARISON:  None. FINDINGS: Lower chest: Lung bases are clear. Hepatobiliary: No focal hepatic lesion. Tiny gallstones within lumen gallbladder. No gallbladder distension. Pancreas: Pancreas is normal. No ductal dilatation. No pancreatic inflammation. Spleen: Normal spleen Adrenals/urinary tract: Adrenal glands and kidneys are normal. The ureters and bladder normal. Stomach/Bowel: Small hiatal hernia. Stomach, duodenum small bowel are normal. The cecum is positioned high at the level of the RIGHT hepatic lobe. There is extensive inflammation involving the cecum. The appendix is distended to 10 mm in diameter. There is an at appendicolith at the orifice of the appendix (image 90). There is a small gas collection positioned between the tip of the appendix and the cecum measuring 2.5 by 1.8 by 4.3 cm. This finding is most consistent with a perforated acute appendicitis. The appendix extends in a retrocecal fashion towards the inferior margin the RIGHT hepatic lobe. There is scattered small foci of intraperitoneal free air in the RIGHT upper quadrant (image 35, series 3 and on image 52, series 2). Small amount fluid along the pericolic gutter. Remainder of the colon is normal. Vascular/Lymphatic: Abdominal aorta is normal caliber. There is no retroperitoneal or periportal lymphadenopathy. No pelvic lymphadenopathy. Reproductive: Prostate normal Other: Smaller free fluid along the RIGHT pericolic gutter Musculoskeletal: No aggressive osseous lesion. IMPRESSION: 1. Findings consistent with acute perforated appendicitis. 2. Small gas collection positioned between the cecum and the appendix and scattered intraperitoneal free air in the RIGHT abdomen. 3. No abscess formation. 4. Cecum is positioned high at the inferior margin the RIGHT hepatic lobe. 5. Small nonobstructing gallstone. Findings  conveyed toROBERT Emmanuel Gruenhagen on 04/14/2016  at13:06. Electronically Signed   By: Genevive BiStewart  Edmunds M.D.   On: 04/14/2016 13:07    Procedures Procedures (including critical care time)  Medications Ordered in ED Medications  HYDROmorphone (DILAUDID) injection 1 mg (not administered)  ondansetron (ZOFRAN) injection 4 mg (not administered)  iopamidol (ISOVUE-300) 61 % injection 30 mL (not administered)     Initial Impression / Assessment and Plan / ED Course  I have reviewed the triage vital signs and the nursing notes.  Pertinent labs & imaging results that were available during my care of the patient were reviewed by me and considered in my medical decision making (see chart for details).  Clinical Course as of Apr 14 1349  Thu Apr 14, 2016  1324 Low Sodium: (!) 133 [EW]  1324 High Glucose: (!) 200 [EW]  1325 Nonspecific visceral perforation, most likely appendicitis. Free air present. Cecal inflammation present. We'll continue general surgery for evaluation, and likely exploratory laparotomy. CT ABDOMEN PELVIS W CONTRAST [EW]    Clinical Course User Index [EW] Mancel BaleElliott Wentz, MD    Sudden onset RLQ pain that started yesterday.  Associated fever and chills.  Will check CT and reassess.  Leukocytosis to 21.1.  Now febrile to 100.2.  CT as above.  Concern for appendicitis with  perforation.  Patient seen by and discussed with DR. Effie ShyWentz, who will consult general surgery.  Pain is well controlled.  Final Clinical Impressions(s) / ED Diagnoses   Final diagnoses:  Acute appendicitis with generalized peritonitis    New Prescriptions New Prescriptions   No medications on file     Roxy HorsemanRobert Trinidad Ingle, PA-C 04/14/16 1454    Mancel BaleElliott Wentz, MD 04/15/16 [phone]

## 2016-04-14 NOTE — Transfer of Care (Signed)
Immediate Anesthesia Transfer of Care Note  Patient: Aaron Trujillo  Procedure(s) Performed: Procedure(s): ileocolonic resection with Meckel's diverticulectomy (N/A)  Patient Location: PACU  Anesthesia Type:General  Level of Consciousness: awake, alert  and patient cooperative  Airway & Oxygen Therapy: Patient Spontanous Breathing and Patient connected to face mask oxygen  Post-op Assessment: Report given to RN and Post -op Vital signs reviewed and stable  Post vital signs: Reviewed and stable  Last Vitals:  Vitals:   04/14/16 1615 04/14/16 2000  BP:  (!) 145/104  Pulse: 91 97  Resp: (!) 29 (!) 21  Temp:  36.5 C    Last Pain:  Vitals:   04/14/16 1543  TempSrc:   PainSc: 2          Complications: No apparent anesthesia complications

## 2016-04-14 NOTE — ED Triage Notes (Signed)
Pt c/o severe RLQ abdominal pain since yesterday morning. Denies N/V/D. Denies urinary symptoms. Pt has hx appendix. Pt last normal BM was yesterday. Pt sts he has lost his appetite. A&Ox4 and ambulatory.

## 2016-04-15 LAB — BASIC METABOLIC PANEL
Anion gap: 9 (ref 5–15)
BUN: 21 mg/dL — ABNORMAL HIGH (ref 6–20)
CALCIUM: 8.3 mg/dL — AB (ref 8.9–10.3)
CO2: 22 mmol/L (ref 22–32)
CREATININE: 1.3 mg/dL — AB (ref 0.61–1.24)
Chloride: 103 mmol/L (ref 101–111)
GFR calc non Af Amer: >60 mL/min (ref >60)
Glucose, Bld: 153 mg/dL — ABNORMAL HIGH (ref 65–99)
Potassium: 4.4 mmol/L (ref 3.5–5.1)
Sodium: 134 mmol/L — ABNORMAL LOW (ref 135–145)

## 2016-04-15 LAB — URINE MICROSCOPIC-ADD ON

## 2016-04-15 LAB — URINALYSIS, ROUTINE W REFLEX MICROSCOPIC
GLUCOSE, UA: NEGATIVE mg/dL
KETONES UR: NEGATIVE mg/dL
NITRITE: POSITIVE — AB
PH: 5.5 (ref 5.0–8.0)
Protein, ur: 30 mg/dL — AB
SPECIFIC GRAVITY, URINE: 1.043 — AB (ref 1.005–1.030)

## 2016-04-15 LAB — MAGNESIUM: MAGNESIUM: 1.6 mg/dL — AB (ref 1.7–2.4)

## 2016-04-15 LAB — CBC
HCT: 48.9 % (ref 39.0–52.0)
Hemoglobin: 16.8 g/dL (ref 13.0–17.0)
MCH: 31.8 pg (ref 26.0–34.0)
MCHC: 34.4 g/dL (ref 30.0–36.0)
MCV: 92.4 fL (ref 78.0–100.0)
PLATELETS: 343 10*3/uL (ref 150–400)
RBC: 5.29 MIL/uL (ref 4.22–5.81)
RDW: 12.8 % (ref 11.5–15.5)
WBC: 8.7 10*3/uL (ref 4.0–10.5)

## 2016-04-15 LAB — GLUCOSE, CAPILLARY
GLUCOSE-CAPILLARY: 148 mg/dL — AB (ref 65–99)
Glucose-Capillary: 123 mg/dL — ABNORMAL HIGH (ref 65–99)
Glucose-Capillary: 140 mg/dL — ABNORMAL HIGH (ref 65–99)
Glucose-Capillary: 143 mg/dL — ABNORMAL HIGH (ref 65–99)

## 2016-04-15 MED ORDER — METHOCARBAMOL 1000 MG/10ML IJ SOLN
1000.0000 mg | Freq: Three times a day (TID) | INTRAMUSCULAR | Status: AC
  Administered 2016-04-15 – 2016-04-18 (×8): 1000 mg via INTRAVENOUS
  Filled 2016-04-15 (×9): qty 10

## 2016-04-15 MED ORDER — KETOROLAC TROMETHAMINE 15 MG/ML IJ SOLN
15.0000 mg | Freq: Four times a day (QID) | INTRAMUSCULAR | Status: AC
  Administered 2016-04-15 – 2016-04-18 (×12): 15 mg via INTRAVENOUS
  Filled 2016-04-15 (×12): qty 1

## 2016-04-15 MED ORDER — FENTANYL CITRATE (PF) 100 MCG/2ML IJ SOLN
25.0000 ug | INTRAMUSCULAR | Status: DC | PRN
  Filled 2016-04-15: qty 2

## 2016-04-15 NOTE — Progress Notes (Signed)
Received a referral for financial concerns. Patient is uninsured. Spoke with patient and spouse at bedside. Patient is concerned about billing, assured him that nothing could be done until billing was complete and arrangements could be made with billing at that time. Discussed follow up care, provided resources for PCP, discussed he would likely just need surgical follow up as he has no other medical conditions at this time. He states that he could not afford insurance offered on the exchange and has no other options. He uses urgent care for primary medical needs, pays out of pocket for that and meds. Discussed Match and how that would work. Has vac in place, would likely need to apply for charity option for this if d/c home with device. Will continue to follow for d/c needs.

## 2016-04-15 NOTE — Consult Note (Signed)
WOC Nurse wound consult note Reason for Consult:Midline abdominal surgical wound.  Placed yesterday with orders to change on Tues/Thursday/Saturday.  Due again tomorrow, 04/16/16.  This will be first post op dressing change with surgery. Bedside nurse will assist with this. Supplies ordered and at bedside.  Wound type:Midline abdominal surgical Pressure Ulcer POA: N/A  Drainage (amount, consistency, odor) None in canister  Seal intact Periwound:Intact Dressing procedure/placement/frequency:Change VAC Tuesday/Thursday/Saturday.  First postop dressing change tomorrow. Supplies ordered and at bedside.  WOC team will follow as needed.  Maple HudsonKaren Haiden Rawlinson RN BSN CWON Pager [phone]

## 2016-04-15 NOTE — Progress Notes (Signed)
[address] [place], [place] [address] Phone: [phone] FAX: [place] [phone] 1970-08-24  CARE TEAM:  PCP: No PCP Per Patient  Outpatient Care Team: Patient Care Team: No Pcp Per Patient as PCP - General ([place]) Michael Boston, MD as Consulting Physician (General Surgery)  Inpatient Treatment Team: Treatment Team: Attending Provider: Md Edison Pace, MD; Rounding Team: Md Edison Pace, MD; Registered Nurse: Joselyn Glassman, RN; Technician: Leda Quail, NT; Technician: Etheleen Sia, NT  Problem List:   Principal Problem:   Acute gangrenous appendicitis with perforation and peritonitis s/p ileocolectomy 04/14/2016 Active Problems:   Hyperglycemia   Meckels diverticulum s/p diverticulectomy 04/14/2016   1 Day Post-Op  04/14/2016  POST-OPERATIVE DIAGNOSIS:    Gangrenous appendicitis with perforation & focal peritonitis Meckel's Diverticulum  PROCEDURE:    Lap-assisted ileocolonic resection Omentopexy of ileocolonic anastomosis Meckel's diverticulectomy  SURGEON:  Surgeon(s): Michael Boston, MD  OR FINDINGS: Retrocecal appendix.  Gangrenous with perforation.  Right-sided abdominal peritonitis.  Meckel's diverticulum at mid ileum with atypical tip appearance.  Resected.   Assessment  Stable with ileus due to peritonitis from perforated gangrenous appendicitis with Meckel's diverticulum  Plan:  -Switched to fentanyl since hydromorphone very sedating.  See if that works without sedating him.  Add around-the-clock Robaxin and Toradol for a few days to minimize narcotic use.  Nasogastric tube until ileus resolves.  Remove Foley.  IV fluids to avoid dehydration.  IV antibiotics for five days given moderate peritonitis.  -VTE prophylaxis- SCDs, etc -mobilize as tolerated to help recovery  Adin Hector, M.D., F.A.C.S. Gastrointestinal and Minimally Invasive  Surgery [facility] [address], [place] [address] [place], [place] [address] [phone] Main / Paging   04/15/2016  Subjective:  Sore.  Hydromorphone IV helps with pain but makes him sleep.  Up in chair.  Wife at bedside.  Objective:  Vital signs:  Vitals:   04/14/16 2255 04/14/16 2355 04/15/16 0155 04/15/16 0415  BP: (!) 126/92 115/81 113/83 121/89  Pulse: 92 78 86 96  Resp: _0 Temp: 98.2 F (36.8 C) 98.2 F (36.8 C) 98.3 F (36.8 C) 98.4 F (36.9 C)  TempSrc: Oral Oral Oral Oral  SpO2: 98% 100% 98% 97%  Weight:      Height:        Last BM Date: 04/13/16  Intake/Output   Yesterday:  11/23 0701 - 11/24 0700 In: 3305 [P.O.:20; I.V.:3135; NG/GT:50; IV Piggyback:100] Out: 5732 [Urine:850; Emesis/NG output:25; Blood:400] This shift:  Total I/O In: 380 [I.V.:350; NG/GT:30] Out: 0   Bowel function:  Flatus: No  BM:  No  Drain: NG tube with medium dark bilious output   Physical Exam:  General: Pt awake/alert/oriented x4 in mild acute distress Eyes: PERRL, normal EOM.  Sclera clear.  No icterus Neuro: CN II-XII intact w/o focal sensory/motor deficits. Lymph: No head/neck/groin lymphadenopathy Psych:  No delerium/psychosis/paranoia HENT: Normocephalic, Mucus membranes moist.  No thrush.  NGT in place Neck: Supple, No tracheal deviation Chest: No chest wall pain w good excursion CV:  Pulses intact.  Regular rhythm MS: Normal AROM mjr joints.  No obvious deformity Abdomen: Somewhat firm.  Moderately distended.  Mildly tender at incisions only.  No evidence of peritonitis.  No incarcerated hernias. Ext:  SCDs BLE.  No mjr edema.  No cyanosis Skin: No petechiae / purpura  Results:   Labs: Results for orders placed or performed during the hospital  encounter of 04/14/16 (from the past 48 hour(s))  Lipase, blood     Status: None   Collection Time: 04/14/16 11:05 AM  Result Value Ref Range   Lipase 18 11 - 51 U/L   Comprehensive metabolic panel     Status: Abnormal   Collection Time: 04/14/16 11:05 AM  Result Value Ref Range   Sodium 133 (L) 135 - 145 mmol/L   Potassium 4.3 3.5 - 5.1 mmol/L   Chloride 100 (L) 101 - 111 mmol/L   CO2 24 22 - 32 mmol/L   Glucose, Bld 200 (H) 65 - 99 mg/dL   BUN 15 6 - 20 mg/dL   Creatinine, Ser 1.19 0.61 - 1.24 mg/dL   Calcium 9.3 8.9 - 10.3 mg/dL   Total Protein 8.0 6.5 - 8.1 g/dL   Albumin 4.7 3.5 - 5.0 g/dL   AST 27 15 - 41 U/L   ALT 31 17 - 63 U/L   Alkaline Phosphatase 74 38 - 126 U/L   Total Bilirubin 2.3 (H) 0.3 - 1.2 mg/dL   GFR calc non Af Amer >60 >60 mL/min   GFR calc Af Amer >60 >60 mL/min    Comment: (NOTE) The eGFR has been calculated using the CKD EPI equation. This calculation has not been validated in all clinical situations. eGFR's persistently <60 mL/min signify possible Chronic Kidney Disease.    Anion gap 9 5 - 15  CBC     Status: Abnormal   Collection Time: 04/14/16 11:05 AM  Result Value Ref Range   WBC 21.1 (H) 4.0 - 10.5 K/uL   RBC 5.21 4.22 - 5.81 MIL/uL   Hemoglobin 16.8 13.0 - 17.0 g/dL   HCT 47.8 39.0 - 52.0 %   MCV 91.7 78.0 - 100.0 fL   MCH 32.2 26.0 - 34.0 pg   MCHC 35.1 30.0 - 36.0 g/dL   RDW 12.7 11.5 - 15.5 %   Platelets 319 150 - 400 K/uL  Culture, blood (routine x 2)     Status: None (Preliminary result)   Collection Time: 04/14/16  3:39 PM  Result Value Ref Range   Specimen Description      BLOOD RIGHT ANTECUBITAL Performed at [facility]    Culture PENDING    Report Status PENDING   Culture, blood (routine x 2)     Status: None (Preliminary result)   Collection Time: 04/14/16  3:42 PM  Result Value Ref Range   Specimen Description      BLOOD LEFT HAND Performed at [facility]    Special Requests BOTTLES DRAWN AEROBIC AND ANAEROBIC 5[facility]    Culture PENDING    Report Status PENDING   I-Stat CG4 Lactic Acid, ED     Status: None    Collection Time: 04/14/16  3:52 PM  Result Value Ref Range   Lactic Acid, Venous 1.10 0.5 - 1.9 mmol/L  CBG monitoring, ED     Status: Abnormal   Collection Time: 04/14/16  4:02 PM  Result Value Ref Range   Glucose-Capillary 169 (H) 65 - 99 mg/dL  Glucose, capillary     Status: Abnormal   Collection Time: 04/14/16  9:28 PM  Result Value Ref Range   Glucose-Capillary 180 (H) 65 - 99 mg/dL  Glucose, capillary     Status: Abnormal   Collection Time: 04/14/16 11:00 PM  Result Value Ref Range   Glucose-Capillary 156 (H) 65 - 99 mg/dL  Urinalysis, Routine w reflex microscopic     Status: Abnormal   Collection Time: 04/15/16  4:40 AM  Result Value Ref Range   Color, Urine ORANGE (A) YELLOW    Comment: BIOCHEMICALS MAY BE AFFECTED BY COLOR   APPearance CLEAR CLEAR   Specific Gravity, Urine 1.043 (H) 1.005 - 1.030   pH 5.5 5.0 - 8.0   Glucose, UA NEGATIVE NEGATIVE mg/dL   Hgb urine dipstick SMALL (A) NEGATIVE   Bilirubin Urine SMALL (A) NEGATIVE   Ketones, ur NEGATIVE NEGATIVE mg/dL   Protein, ur 30 (A) NEGATIVE mg/dL   Nitrite POSITIVE (A) NEGATIVE   Leukocytes, UA TRACE (A) NEGATIVE  Urine microscopic-add on     Status: Abnormal   Collection Time: 04/15/16  4:40 AM  Result Value Ref Range   Squamous Epithelial / LPF 0-5 (A) NONE SEEN   WBC, UA 0-5 0 - 5 WBC/hpf   RBC / HPF 0-5 0 - 5 RBC/hpf   Bacteria, UA FEW (A) NONE SEEN   Urine-Other MUCOUS PRESENT   Basic metabolic panel     Status: Abnormal   Collection Time: 04/15/16  5:01 AM  Result Value Ref Range   Sodium 134 (L) 135 - 145 mmol/L   Potassium 4.4 3.5 - 5.1 mmol/L   Chloride 103 101 - 111 mmol/L   CO2 22 22 - 32 mmol/L   Glucose, Bld 153 (H) 65 - 99 mg/dL   BUN 21 (H) 6 - 20 mg/dL   Creatinine, Ser 1.30 (H) 0.61 - 1.24 mg/dL   Calcium 8.3 (L) 8.9 - 10.3 mg/dL   GFR calc non Af Amer >60 >60 mL/min   GFR calc Af Amer >60 >60 mL/min    Comment: (NOTE) The eGFR has been calculated using the CKD EPI equation. This  calculation has not been validated in all clinical situations. eGFR's persistently <60 mL/min signify possible Chronic Kidney Disease.    Anion gap 9 5 - 15  CBC     Status: None   Collection Time: 04/15/16  5:01 AM  Result Value Ref Range   WBC 8.7 4.0 - 10.5 K/uL   RBC 5.29 4.22 - 5.81 MIL/uL   Hemoglobin 16.8 13.0 - 17.0 g/dL   HCT 48.9 39.0 - 52.0 %   MCV 92.4 78.0 - 100.0 fL   MCH 31.8 26.0 - 34.0 pg   MCHC 34.4 30.0 - 36.0 g/dL   RDW 12.8 11.5 - 15.5 %   Platelets 343 150 - 400 K/uL  Magnesium     Status: Abnormal   Collection Time: 04/15/16  5:01 AM  Result Value Ref Range   Magnesium 1.6 (L) 1.7 - 2.4 mg/dL  Glucose, capillary     Status: Abnormal   Collection Time: 04/15/16  7:56 AM  Result Value Ref Range   Glucose-Capillary 148 (H) 65 - 99 mg/dL    Imaging / Studies: Ct Abdomen Pelvis W Contrast  Result Date: 04/14/2016 CLINICAL DATA:  rlq pain started today eval for appy^182m ISOVUE-300 IOPAMIDOL (ISOVUE-300) INJECTION 61%, 319mISOVUE-300 IOPAMIDOL (ISOVUE-300) INJECTION 61% EXAM: CT ABDOMEN AND PELVIS WITH CONTRAST TECHNIQUE: Multidetector CT imaging of the abdomen and pelvis was performed using the standard protocol following bolus administration of intravenous contrast. CONTRAST:  10068mSOVUE-300 IOPAMIDOL (ISOVUE-300) INJECTION 61%, 44m91mOVUE-300 IOPAMIDOL (ISOVUE-300) INJECTION 61% COMPARISON:  None. FINDINGS: Lower chest: Lung bases are clear. Hepatobiliary: No focal hepatic lesion. Tiny gallstones within lumen gallbladder. No gallbladder distension. Pancreas: Pancreas is normal. No ductal dilatation. No pancreatic  inflammation. Spleen: Normal spleen Adrenals/urinary tract: Adrenal glands and kidneys are normal. The ureters and bladder normal. Stomach/Bowel: Small hiatal hernia. Stomach, duodenum small bowel are normal. The cecum is positioned high at the level of the RIGHT hepatic lobe. There is extensive inflammation involving the cecum. The appendix is  distended to 10 mm in diameter. There is an at appendicolith at the orifice of the appendix (image 90). There is a small gas collection positioned between the tip of the appendix and the cecum measuring 2.5 by 1.8 by 4.3 cm. This finding is most consistent with a perforated acute appendicitis. The appendix extends in a retrocecal fashion towards the inferior margin the RIGHT hepatic lobe. There is scattered small foci of intraperitoneal free air in the RIGHT upper quadrant (image 35, series 3 and on image 52, series 2). Small amount fluid along the pericolic gutter. Remainder of the colon is normal. Vascular/Lymphatic: Abdominal aorta is normal caliber. There is no retroperitoneal or periportal lymphadenopathy. No pelvic lymphadenopathy. Reproductive: Prostate normal Other: Smaller free fluid along the RIGHT pericolic gutter Musculoskeletal: No aggressive osseous lesion. IMPRESSION: 1. Findings consistent with acute perforated appendicitis. 2. Small gas collection positioned between the cecum and the appendix and scattered intraperitoneal free air in the RIGHT abdomen. 3. No abscess formation. 4. Cecum is positioned high at the inferior margin the RIGHT hepatic lobe. 5. Small nonobstructing gallstone. Findings conveyed toROBERT BROWNING on 04/14/2016  at13:06. Electronically Signed   By: Suzy Bouchard M.D.   On: 04/14/2016 13:07    Medications / Allergies: per chart  Antibiotics: Anti-infectives    Start     Dose/Rate Route Frequency Ordered Stop   04/14/16 1730  clindamycin (CLEOCIN) 900 mg, gentamicin (GARAMYCIN) 240 mg in sodium chloride 0.9 % 1,000 mL for intraperitoneal lavage  Status:  Discontinued      Intraperitoneal To Surgery 04/14/16 1719 04/14/16 2103   04/14/16 1545  piperacillin-tazobactam (ZOSYN) IVPB 3.375 g     3.375 g 12.5 mL/hr over 240 Minutes Intravenous Every 8 hours 04/14/16 1533     04/14/16 1530  piperacillin-tazobactam (ZOSYN) IVPB 3.375 g     3.375 g 12.5 mL/hr over 240  Minutes Intravenous  Once 04/14/16 1529 04/14/16 1946   04/14/16 1515  Ampicillin-Sulbactam (UNASYN) 3 g in sodium chloride 0.9 % 100 mL IVPB  Status:  Discontinued     3 g 200 mL/hr over 30 Minutes Intravenous  Once 04/14/16 1514 04/14/16 1529   04/14/16 1515  Ampicillin-Sulbactam (UNASYN) 3 g in sodium chloride 0.9 % 100 mL IVPB  Status:  Discontinued     3 g 200 mL/hr over 30 Minutes Intravenous  Once 04/14/16 1514 04/14/16 1529        Note: Portions of this report may have been transcribed using voice recognition software. Every effort was made to ensure accuracy; however, inadvertent computerized transcription errors may be present.   Any transcriptional errors that result from this process are unintentional.     Adin Hector, M.D., F.A.C.S. Gastrointestinal and Minimally Invasive Surgery [facility] [address] [place], [place] [address] [phone] Main / Paging   04/15/2016

## 2016-04-16 ENCOUNTER — Encounter (HOSPITAL_COMMUNITY): Payer: Self-pay | Admitting: Surgery

## 2016-04-16 DIAGNOSIS — R7303 Prediabetes: Secondary | ICD-10-CM

## 2016-04-16 HISTORY — DX: Prediabetes: R73.03

## 2016-04-16 LAB — GLUCOSE, CAPILLARY
GLUCOSE-CAPILLARY: 128 mg/dL — AB (ref 65–99)
GLUCOSE-CAPILLARY: 98 mg/dL (ref 65–99)
Glucose-Capillary: 129 mg/dL — ABNORMAL HIGH (ref 65–99)
Glucose-Capillary: 97 mg/dL (ref 65–99)

## 2016-04-16 LAB — HEMOGLOBIN A1C
Hgb A1c MFr Bld: 6.1 % — ABNORMAL HIGH (ref 4.8–5.6)
MEAN PLASMA GLUCOSE: 128 mg/dL

## 2016-04-16 NOTE — Progress Notes (Signed)
[address] [place], [place] [address] Phone: [phone] FAX: [phone]   SCOT SHIRAISHI [id number] 04-Feb-1971  CARE TEAM:  PCP: No PCP Per Patient  Outpatient Care Team: Patient Care Team: No Pcp Per Patient as PCP - General ([place]) Michael Boston, MD as Consulting Physician (General Surgery)  Inpatient Treatment Team: Treatment Team: Attending Provider: Md Edison Pace, MD; Rounding Team: Md Edison Pace, MD; Registered Nurse: Joselyn Glassman, RN; Technician: Leda Quail, NT; Technician: Etheleen Sia, NT; Registered Nurse: Celedonio Savage, RN  Problem List:   Principal Problem:   Acute gangrenous appendicitis with perforation and peritonitis s/p ileocolectomy 04/14/2016 Active Problems:   Hyperglycemia   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  Gangrenous with perforation.  Right-sided abdominal peritonitis.  Meckel's diverticulum at mid ileum with atypical tip appearance.  Resected.   Assessment  Stable with ileus due to peritonitis from perforated gangrenous appendicitis with Meckel's diverticulum  Plan:  IV fluids to avoid dehydration.  IV antibiotics for five days given moderate peritonitis.  Nasogastric tube until ileus resolves.  Hemoglobin A1c mildly elevated.  Most lines consider with prediabetes.  Accu-Cheks last 36 hours less than 150.  Stop sliding scale.  Fentanyl since hydromorphone very sedating.  See if that works without sedating him.  Add around-the-clock Robaxin and Toradol for a few days to minimize narcotic  use.  -VTE prophylaxis- SCDs, etc -mobilize as tolerated to help recovery  Adin Hector, M.D., F.A.C.S. Gastrointestinal and Minimally Invasive Surgery [facility] [address], [place], [place] [address] [phone] Main / Paging   04/16/2016  Subjective:  Pain controlled Up in chair. Walked x 3 in hallways Wife at bedside.  Objective:  Vital signs:  Vitals:   04/15/16 0900 04/15/16 1414 04/15/16 2055 04/16/16 0600  BP: 110/83 107/67 115/77 117/73  Pulse: (!) 121 (!) 110 (!) 110 96  Resp: _0 Temp: 98.5 F (36.9 C) 98.1 F (36.7 C) 98.8 F (37.1 C) 97.7 F (36.5 C)  TempSrc: Oral Oral Oral Oral  SpO2: 93% 94% 93% 92%  Weight:      Height:        Last BM Date: 04/13/16  Intake/Output   Yesterday:  11/24 0701 - 11/25 0700 In: 2791.7 [I.V.:2451.7; NG/GT:90; IV Piggyback:250] Out: 1572 [Urine:1150; Emesis/NG output:310] This shift:  No intake/output data recorded.  Bowel function:  Flatus: No  BM:  No  Drain: NG tube with medium dark bilious output   Physical Exam:  General: Pt awake/alert/oriented x4 in mild acute distress Eyes: PERRL, normal EOM.  Sclera clear.  No icterus Neuro: CN II-XII intact w/o focal sensory/motor deficits. Lymph: No head/neck/groin lymphadenopathy Psych:  No delerium/psychosis/paranoia HENT: Normocephalic, Mucus membranes moist.  No thrush.  NGT in place Neck: Supple, No tracheal deviation Chest: No chest wall pain w good excursion CV:  Pulses intact.  Regular rhythm MS: Normal AROM mjr joints.  No obvious deformity Abdomen: Somewhat firm.  Moderately distended.  Mildly tender at incisions only.  Wound vac clean.  No evidence of peritonitis.  No incarcerated hernias. Ext:  SCDs BLE.  No mjr edema.  No cyanosis Skin: No  petechiae / purpura  Results:   Labs: Results for orders placed or performed during the hospital encounter of 04/14/16 (from the past 48 hour(s))   Lipase, blood     Status: None   Collection Time: 04/14/16 11:05 AM  Result Value Ref Range   Lipase 18 11 - 51 U/L  Comprehensive metabolic panel     Status: Abnormal   Collection Time: 04/14/16 11:05 AM  Result Value Ref Range   Sodium 133 (L) 135 - 145 mmol/L   Potassium 4.3 3.5 - 5.1 mmol/L   Chloride 100 (L) 101 - 111 mmol/L   CO2 24 22 - 32 mmol/L   Glucose, Bld 200 (H) 65 - 99 mg/dL   BUN 15 6 - 20 mg/dL   Creatinine, Ser 1.19 0.61 - 1.24 mg/dL   Calcium 9.3 8.9 - 10.3 mg/dL   Total Protein 8.0 6.5 - 8.1 g/dL   Albumin 4.7 3.5 - 5.0 g/dL   AST 27 15 - 41 U/L   ALT 31 17 - 63 U/L   Alkaline Phosphatase 74 38 - 126 U/L   Total Bilirubin 2.3 (H) 0.3 - 1.2 mg/dL   GFR calc non Af Amer >60 >60 mL/min   GFR calc Af Amer >60 >60 mL/min    Comment: (NOTE) The eGFR has been calculated using the CKD EPI equation. This calculation has not been validated in all clinical situations. eGFR's persistently <60 mL/min signify possible Chronic Kidney Disease.    Anion gap 9 5 - 15  CBC     Status: Abnormal   Collection Time: 04/14/16 11:05 AM  Result Value Ref Range   WBC 21.1 (H) 4.0 - 10.5 K/uL   RBC 5.21 4.22 - 5.81 MIL/uL   Hemoglobin 16.8 13.0 - 17.0 g/dL   HCT 47.8 39.0 - 52.0 %   MCV 91.7 78.0 - 100.0 fL   MCH 32.2 26.0 - 34.0 pg   MCHC 35.1 30.0 - 36.0 g/dL   RDW 12.7 11.5 - 15.5 %   Platelets 319 150 - 400 K/uL  Culture, blood (routine x 2)     Status: None (Preliminary result)   Collection Time: 04/14/16  3:39 PM  Result Value Ref Range   Specimen Description BLOOD RIGHT ANTECUBITAL    Special Requests BOTTLES DRAWN AEROBIC AND ANAEROBIC 5CC    Culture      NO GROWTH < 24 HOURS Performed at [facility]    Report Status PENDING   Culture, blood (routine x 2)     Status: None (Preliminary result)   Collection Time: 04/14/16  3:42 PM  Result Value Ref Range   Specimen Description BLOOD LEFT HAND    Special Requests BOTTLES DRAWN AEROBIC AND ANAEROBIC 5CC     Culture      NO GROWTH < 24 HOURS Performed at [facility]    Report Status PENDING   I-Stat CG4 Lactic Acid, ED     Status: None   Collection Time: 04/14/16  3:52 PM  Result Value Ref Range   Lactic Acid, Venous 1.10 0.5 - 1.9 mmol/L  CBG monitoring, ED     Status: Abnormal   Collection Time: 04/14/16  4:02 PM  Result Value Ref Range   Glucose-Capillary 169 (H) 65 - 99 mg/dL  Glucose, capillary     Status: Abnormal   Collection Time: 04/14/16  9:28 PM  Result Value Ref Range   Glucose-Capillary 180 (H) 65 - 99 mg/dL  Glucose, capillary  Status: Abnormal   Collection Time: 04/14/16 11:00 PM  Result Value Ref Range   Glucose-Capillary 156 (H) 65 - 99 mg/dL  Urinalysis, Routine w reflex microscopic     Status: Abnormal   Collection Time: 04/15/16  4:40 AM  Result Value Ref Range   Color, Urine ORANGE (A) YELLOW    Comment: BIOCHEMICALS MAY BE AFFECTED BY COLOR   APPearance CLEAR CLEAR   Specific Gravity, Urine 1.043 (H) 1.005 - 1.030   pH 5.5 5.0 - 8.0   Glucose, UA NEGATIVE NEGATIVE mg/dL   Hgb urine dipstick SMALL (A) NEGATIVE   Bilirubin Urine SMALL (A) NEGATIVE   Ketones, ur NEGATIVE NEGATIVE mg/dL   Protein, ur 30 (A) NEGATIVE mg/dL   Nitrite POSITIVE (A) NEGATIVE   Leukocytes, UA TRACE (A) NEGATIVE  Urine microscopic-add on     Status: Abnormal   Collection Time: 04/15/16  4:40 AM  Result Value Ref Range   Squamous Epithelial / LPF 0-5 (A) NONE SEEN   WBC, UA 0-5 0 - 5 WBC/hpf   RBC / HPF 0-5 0 - 5 RBC/hpf   Bacteria, UA FEW (A) NONE SEEN   Urine-Other MUCOUS PRESENT   Hemoglobin A1c     Status: Abnormal   Collection Time: 04/15/16  5:01 AM  Result Value Ref Range   Hgb A1c MFr Bld 6.1 (H) 4.8 - 5.6 %    Comment: (NOTE)         Pre-diabetes: 5.7 - 6.4         Diabetes: >6.4         Glycemic control for adults with diabetes: <7.0    Mean Plasma Glucose 128 mg/dL    Comment: (NOTE) Performed At: [facility] [place],  [place] [id number] Lindon Romp MD IO:[id number]   Basic metabolic panel     Status: Abnormal   Collection Time: 04/15/16  5:01 AM  Result Value Ref Range   Sodium 134 (L) 135 - 145 mmol/L   Potassium 4.4 3.5 - 5.1 mmol/L   Chloride 103 101 - 111 mmol/L   CO2 22 22 - 32 mmol/L   Glucose, Bld 153 (H) 65 - 99 mg/dL   BUN 21 (H) 6 - 20 mg/dL   Creatinine, Ser 1.30 (H) 0.61 - 1.24 mg/dL   Calcium 8.3 (L) 8.9 - 10.3 mg/dL   GFR calc non Af Amer >60 >60 mL/min   GFR calc Af Amer >60 >60 mL/min    Comment: (NOTE) The eGFR has been calculated using the CKD EPI equation. This calculation has not been validated in all clinical situations. eGFR's persistently <60 mL/min signify possible Chronic Kidney Disease.    Anion gap 9 5 - 15  CBC     Status: None   Collection Time: 04/15/16  5:01 AM  Result Value Ref Range   WBC 8.7 4.0 - 10.5 K/uL   RBC 5.29 4.22 - 5.81 MIL/uL   Hemoglobin 16.8 13.0 - 17.0 g/dL   HCT 48.9 39.0 - 52.0 %   MCV 92.4 78.0 - 100.0 fL   MCH 31.8 26.0 - 34.0 pg   MCHC 34.4 30.0 - 36.0 g/dL   RDW 12.8 11.5 - 15.5 %   Platelets 343 150 - 400 K/uL  Magnesium     Status: Abnormal   Collection Time: 04/15/16  5:01 AM  Result Value Ref Range   Magnesium 1.6 (L) 1.7 - 2.4 mg/dL  Glucose, capillary     Status: Abnormal  Collection Time: 04/15/16  7:56 AM  Result Value Ref Range   Glucose-Capillary 148 (H) 65 - 99 mg/dL  Glucose, capillary     Status: Abnormal   Collection Time: 04/15/16  3:54 PM  Result Value Ref Range   Glucose-Capillary 140 (H) 65 - 99 mg/dL  Glucose, capillary     Status: Abnormal   Collection Time: 04/15/16  8:33 PM  Result Value Ref Range   Glucose-Capillary 143 (H) 65 - 99 mg/dL  Glucose, capillary     Status: Abnormal   Collection Time: 04/15/16 11:55 PM  Result Value Ref Range   Glucose-Capillary 123 (H) 65 - 99 mg/dL  Glucose, capillary     Status: Abnormal   Collection Time: 04/16/16  3:55 AM  Result Value Ref Range    Glucose-Capillary 128 (H) 65 - 99 mg/dL  Glucose, capillary     Status: Abnormal   Collection Time: 04/16/16  7:37 AM  Result Value Ref Range   Glucose-Capillary 129 (H) 65 - 99 mg/dL    Imaging / Studies: Ct Abdomen Pelvis W Contrast  Result Date: 04/14/2016 CLINICAL DATA:  rlq pain started today eval for appy^139m ISOVUE-300 IOPAMIDOL (ISOVUE-300) INJECTION 61%, 382mISOVUE-300 IOPAMIDOL (ISOVUE-300) INJECTION 61% EXAM: CT ABDOMEN AND PELVIS WITH CONTRAST TECHNIQUE: Multidetector CT imaging of the abdomen and pelvis was performed using the standard protocol following bolus administration of intravenous contrast. CONTRAST:  10020mSOVUE-300 IOPAMIDOL (ISOVUE-300) INJECTION 61%, 41m17mOVUE-300 IOPAMIDOL (ISOVUE-300) INJECTION 61% COMPARISON:  None. FINDINGS: Lower chest: Lung bases are clear. Hepatobiliary: No focal hepatic lesion. Tiny gallstones within lumen gallbladder. No gallbladder distension. Pancreas: Pancreas is normal. No ductal dilatation. No pancreatic inflammation. Spleen: Normal spleen Adrenals/urinary tract: Adrenal glands and kidneys are normal. The ureters and bladder normal. Stomach/Bowel: Small hiatal hernia. Stomach, duodenum small bowel are normal. The cecum is positioned high at the level of the RIGHT hepatic lobe. There is extensive inflammation involving the cecum. The appendix is distended to 10 mm in diameter. There is an at appendicolith at the orifice of the appendix (image 90). There is a small gas collection positioned between the tip of the appendix and the cecum measuring 2.5 by 1.8 by 4.3 cm. This finding is most consistent with a perforated acute appendicitis. The appendix extends in a retrocecal fashion towards the inferior margin the RIGHT hepatic lobe. There is scattered small foci of intraperitoneal free air in the RIGHT upper quadrant (image 35, series 3 and on image 52, series 2). Small amount fluid along the pericolic gutter. Remainder of the colon is normal.  Vascular/Lymphatic: Abdominal aorta is normal caliber. There is no retroperitoneal or periportal lymphadenopathy. No pelvic lymphadenopathy. Reproductive: Prostate normal Other: Smaller free fluid along the RIGHT pericolic gutter Musculoskeletal: No aggressive osseous lesion. IMPRESSION: 1. Findings consistent with acute perforated appendicitis. 2. Small gas collection positioned between the cecum and the appendix and scattered intraperitoneal free air in the RIGHT abdomen. 3. No abscess formation. 4. Cecum is positioned high at the inferior margin the RIGHT hepatic lobe. 5. Small nonobstructing gallstone. Findings conveyed toROBERT BROWNING on 04/14/2016  at13:06. Electronically Signed   By: StewSuzy Bouchard.   On: 04/14/2016 13:07    Medications / Allergies: per chart  Antibiotics: Anti-infectives    Start     Dose/Rate Route Frequency Ordered Stop   04/14/16 1730  clindamycin (CLEOCIN) 900 mg, gentamicin (GARAMYCIN) 240 mg in sodium chloride 0.9 % 1,000 mL for intraperitoneal lavage  Status:  Discontinued  Intraperitoneal To Surgery 04/14/16 1719 04/14/16 2103   04/14/16 1545  piperacillin-tazobactam (ZOSYN) IVPB 3.375 g     3.375 g 12.5 mL/hr over 240 Minutes Intravenous Every 8 hours 04/14/16 1533     04/14/16 1530  piperacillin-tazobactam (ZOSYN) IVPB 3.375 g     3.375 g 12.5 mL/hr over 240 Minutes Intravenous  Once 04/14/16 1529 04/14/16 1946   04/14/16 1515  Ampicillin-Sulbactam (UNASYN) 3 g in sodium chloride 0.9 % 100 mL IVPB  Status:  Discontinued     3 g 200 mL/hr over 30 Minutes Intravenous  Once 04/14/16 1514 04/14/16 1529   04/14/16 1515  Ampicillin-Sulbactam (UNASYN) 3 g in sodium chloride 0.9 % 100 mL IVPB  Status:  Discontinued     3 g 200 mL/hr over 30 Minutes Intravenous  Once 04/14/16 1514 04/14/16 1529        Note: Portions of this report may have been transcribed using voice recognition software. Every effort was made to ensure accuracy; however, inadvertent  computerized transcription errors may be present.   Any transcriptional errors that result from this process are unintentional.     Adin Hector, M.D., F.A.C.S. Gastrointestinal and Minimally Invasive Surgery [facility] [address], [place], [place] [address] [phone] Main / Paging   04/16/2016

## 2016-04-17 LAB — GLUCOSE, CAPILLARY
GLUCOSE-CAPILLARY: 114 mg/dL — AB (ref 65–99)
GLUCOSE-CAPILLARY: 124 mg/dL — AB (ref 65–99)
GLUCOSE-CAPILLARY: 89 mg/dL (ref 65–99)
Glucose-Capillary: 156 mg/dL — ABNORMAL HIGH (ref 65–99)
Glucose-Capillary: 173 mg/dL — ABNORMAL HIGH (ref 65–99)
Glucose-Capillary: 122 mg/dL — ABNORMAL HIGH (ref 65–99)

## 2016-04-17 MED ORDER — LACTATED RINGERS IV BOLUS (SEPSIS): 1000.0000 mL | Freq: Three times a day (TID) | INTRAVENOUS | Status: AC | PRN

## 2016-04-17 NOTE — Progress Notes (Signed)
[facility]  [address]  [place], [place] [address] Phone: [phone] FAX: [phone]   Evern BioShannon W Chamber [id number] 1971-04-18  CARE TEAM:  PCP: No PCP Per Patient  Outpatient Care Team: Patient Care Team: No Pcp Per Patient as PCP - General (General Practice) Karie SodaSteven Charisa Twitty, MD as Consulting Physician (General Surgery)  Inpatient Treatment Team: Treatment Team: Attending Provider: Md Montez Moritacs, MD; Rounding Team: Md Montez Moritacs, MD; Registered Nurse: Vilma MeckelKristin R Reid, RN; Technician: Vella RaringBelinda Bryant-Briggs, NT; Technician: Genoveva IllAlexandra M Vanada, NT; Registered Nurse: Dina RichBriana D Richardson, RN  Problem List:   Principal Problem:   Acute gangrenous appendicitis with perforation and peritonitis s/p ileocolectomy 04/14/2016 Active Problems:   Hyperglycemia   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  Gangrenous with perforation.  Right-sided abdominal peritonitis.  Meckel's diverticulum at mid ileum with atypical tip appearance.  Resected.   Assessment  Stable with ileus possibly resolving due to peritonitis from perforated gangrenous appendicitis with Meckel's diverticulum  Plan:  Nasogastric tube clamping trial.  If tolerates clear liquids and low output with clamping trial, and has flatus; then, remove NG tube in morning and advance to full's.  IV fluids to avoid dehydration.  IV antibiotics for five days given moderate peritonitis.  Hemoglobin A1c mildly elevated.  Most lines consider with prediabetes.  Accu-Cheks last 36 hours less than 150.  Stop sliding scale.  Fentanyl  PRN Around-the-clock Robaxin and Toradol for a few days to minimize narcotic use.  VTE prophylaxis- SCDs, etc  Mobilize as tolerated to help recovery  Ardeth SportsmanSteven C. Nara Paternoster, M.D., F.A.C.S. Gastrointestinal and Minimally Invasive Surgery [facility] [address] [place], [place] [address] [phone] Main / Paging   04/17/2016  Subjective:  Pain controlled Walking a lot in hallways Wife at bedside.  Objective:  Vital signs:  Vitals:   04/16/16 0600 04/16/16 1327 04/16/16 2216 04/17/16 0545  BP: 117/73 124/88 124/79 108/71  Pulse: 96 (!) 111 (!) 105 84  Resp: 20 18 18 18   Temp: 97.7 F (36.5 C) 98.2 F (36.8 C) 97.8 F (36.6 C) 98.4 F (36.9 C)  TempSrc: Oral Oral Oral Oral  SpO2: 92% 91% 95% 94%  Weight:      Height:        Last BM Date: 04/13/16  Intake/Output   Yesterday:  11/25 0701 - 11/26 0700 In: 2309.4 [I.V.:2047; IV Piggyback:262.4] Out: 64402475 [Urine:1125; Emesis/NG output:1350] This shift:  Total I/O In: 160 [P.O.:160] Out: 350 [Urine:250; Emesis/NG output:100]  Bowel function:  Flatus: YES  BM:  No  Drain: NG tube with thin bilious output   Physical Exam:  General: Pt awake/alert/oriented x4 in No acute distress Eyes: PERRL, normal EOM.  Sclera clear.  No icterus Neuro: CN II-XII intact w/o focal sensory/motor deficits. Lymph: No head/neck/groin lymphadenopathy Psych:  No delerium/psychosis/paranoia HENT: Normocephalic, Mucus membranes moist.  No thrush.  NGT in place Neck: Supple, No tracheal deviation Chest: No chest wall pain w good excursion CV:  Pulses intact.  Regular rhythm MS: Normal AROM mjr joints.  No obvious deformity Abdomen: Soft.  Mildy distended.  Mildly tender at incisions only.  Wound vac clean.  No evidence of peritonitis.  No  incarcerated hernias. Ext:  SCDs BLE.  No mjr edema.  No cyanosis Skin: No petechiae / purpura  Results:   Labs: Results for orders placed or performed  during the hospital encounter of 04/14/16 (from the past 48 hour(s))  Glucose, capillary     Status: Abnormal   Collection Time: 04/15/16 12:12 PM  Result Value Ref Range   Glucose-Capillary 173 (H) 65 - 99 mg/dL  Glucose, capillary     Status: Abnormal   Collection Time: 04/15/16  3:54 PM  Result Value Ref Range   Glucose-Capillary 140 (H) 65 - 99 mg/dL  Glucose, capillary     Status: Abnormal   Collection Time: 04/15/16  8:33 PM  Result Value Ref Range   Glucose-Capillary 143 (H) 65 - 99 mg/dL  Glucose, capillary     Status: Abnormal   Collection Time: 04/15/16 11:55 PM  Result Value Ref Range   Glucose-Capillary 123 (H) 65 - 99 mg/dL  Glucose, capillary     Status: Abnormal   Collection Time: 04/16/16  3:55 AM  Result Value Ref Range   Glucose-Capillary 128 (H) 65 - 99 mg/dL  Glucose, capillary     Status: Abnormal   Collection Time: 04/16/16  7:37 AM  Result Value Ref Range   Glucose-Capillary 129 (H) 65 - 99 mg/dL  Glucose, capillary     Status: None   Collection Time: 04/16/16 11:56 AM  Result Value Ref Range   Glucose-Capillary 98 65 - 99 mg/dL  Glucose, capillary     Status: None   Collection Time: 04/16/16  5:00 PM  Result Value Ref Range   Glucose-Capillary 97 65 - 99 mg/dL  Glucose, capillary     Status: Abnormal   Collection Time: 04/16/16  8:12 PM  Result Value Ref Range   Glucose-Capillary 122 (H) 65 - 99 mg/dL  Glucose, capillary     Status: Abnormal   Collection Time: 04/17/16 12:08 AM  Result Value Ref Range   Glucose-Capillary 114 (H) 65 - 99 mg/dL  Glucose, capillary     Status: Abnormal   Collection Time: 04/17/16  4:05 AM  Result Value Ref Range   Glucose-Capillary 124 (H) 65 - 99 mg/dL  Glucose, capillary     Status: None   Collection Time: 04/17/16  8:32 AM  Result Value Ref Range   Glucose-Capillary 89 65 - 99 mg/dL    Imaging / Studies: No results found.  Medications / Allergies: per chart  Antibiotics: Anti-infectives    Start      Dose/Rate Route Frequency Ordered Stop   04/14/16 1730  clindamycin (CLEOCIN) 900 mg, gentamicin (GARAMYCIN) 240 mg in sodium chloride 0.9 % 1,000 mL for intraperitoneal lavage  Status:  Discontinued      Intraperitoneal To Surgery 04/14/16 1719 04/14/16 2103   04/14/16 1545  piperacillin-tazobactam (ZOSYN) IVPB 3.375 g     3.375 g 12.5 mL/hr over 240 Minutes Intravenous Every 8 hours 04/14/16 1533 04/19/16 1159   04/14/16 1530  piperacillin-tazobactam (ZOSYN) IVPB 3.375 g     3.375 g 12.5 mL/hr over 240 Minutes Intravenous  Once 04/14/16 1529 04/14/16 1946   04/14/16 1515  Ampicillin-Sulbactam (UNASYN) 3 g in sodium chloride 0.9 % 100 mL IVPB  Status:  Discontinued     3 g 200 mL/hr over 30 Minutes Intravenous  Once 04/14/16 1514 04/14/16 1529   04/14/16 1515  Ampicillin-Sulbactam (UNASYN) 3 g in sodium chloride 0.9 % 100 mL IVPB  Status:  Discontinued     3 g  200 mL/hr over 30 Minutes Intravenous  Once 04/14/16 1514 04/14/16 1529        Note: Portions of this report may have been transcribed using voice recognition software. Every effort was made to ensure accuracy; however, inadvertent computerized transcription errors may be present.   Any transcriptional errors that result from this process are unintentional.     Ardeth SportsmanSteven C. Zabdi Mis, M.D., F.A.C.S. Gastrointestinal and Minimally Invasive Surgery [facility] [address] [place], [place] [address] [phone] Main / Paging   04/17/2016

## 2016-04-18 ENCOUNTER — Encounter (HOSPITAL_COMMUNITY): Payer: Self-pay | Admitting: Surgery

## 2016-04-18 LAB — MAGNESIUM: Magnesium: 2.6 mg/dL — ABNORMAL HIGH (ref 1.7–2.4)

## 2016-04-18 LAB — BASIC METABOLIC PANEL
Anion gap: 13 (ref 5–15)
BUN: 42 mg/dL — AB (ref 6–20)
CHLORIDE: 102 mmol/L (ref 101–111)
CO2: 21 mmol/L — AB (ref 22–32)
CREATININE: 1.37 mg/dL — AB (ref 0.61–1.24)
Calcium: 8.9 mg/dL (ref 8.9–10.3)
GFR calc Af Amer: >60 mL/min (ref >60)
GFR calc non Af Amer: >60 mL/min (ref >60)
Glucose, Bld: 121 mg/dL — ABNORMAL HIGH (ref 65–99)
POTASSIUM: 3.4 mmol/L — AB (ref 3.5–5.1)
SODIUM: 136 mmol/L (ref 135–145)

## 2016-04-18 LAB — CBC
HEMATOCRIT: 40.4 % (ref 39.0–52.0)
Hemoglobin: 13.6 g/dL (ref 13.0–17.0)
MCH: 31.6 pg (ref 26.0–34.0)
MCHC: 33.7 g/dL (ref 30.0–36.0)
MCV: 93.7 fL (ref 78.0–100.0)
PLATELETS: 441 10*3/uL — AB (ref 150–400)
RBC: 4.31 MIL/uL (ref 4.22–5.81)
RDW: 13.6 % (ref 11.5–15.5)
WBC: 15 10*3/uL — AB (ref 4.0–10.5)

## 2016-04-18 NOTE — Progress Notes (Signed)
At shift change, pt stated that he felt nauseated. Abdomen distended. NG tube was unclamped and 600cc of green liquid was aspirated within 2 minutes. Zofran and benadryl given for relief and pt has been resting well ever since. Will continue to monitor.

## 2016-04-18 NOTE — Progress Notes (Signed)
Patient ID: Aaron Trujillo, male   DOB: 1970/09/29, 45 y.o.   MRN: [id number]   LOS: 4 days   POD#4  Subjective: Did well with clamping yesterday and tolerated clears until about 2400-0100. Then began several episodes of diarrhea with some solid stool mixed in. Later this morning he began to get distended and a little nauseated. RN reconnected his suction and evacuated large volume bilious discharge.   Objective: Vital signs in last 24 hours: Temp:  [97.9 F (36.6 C)-99 F (37.2 C)] 99 F (37.2 C) (11/27 0512) Pulse Rate:  [91-96] 91 (11/27 0512) Resp:  [20] 20 (11/27 0512) BP: (127-131)/(85-91) 127/85 (11/27 0512) SpO2:  [95 %-99 %] 95 % (11/27 0512) Last BM Date: 04/18/16   Laboratory  CBC  Recent Labs  04/18/16 0423  WBC 15.0*  HGB 13.6  HCT 40.4  PLT 441*   BMET  Recent Labs  04/18/16 0423  NA 136  K 3.4*  CL 102  CO2 21*  GLUCOSE 121*  BUN 42*  CREATININE 1.37*  CALCIUM 8.9    Physical Exam General appearance: alert and no distress Resp: clear to auscultation bilaterally Cardio: regular rate and rhythm GI: Soft, appropriate TTP, hypoactive BS, VAC in place   Assessment/Plan: Gangrenous appendicitis and Meckel's Diverticulum s/p lap-assisted ileocolonic resection, omentopexy of ileocolonic anastomosis, and Meckel's diverticulectomy 11/23 Dr. Michaell CowingGross -- Continues with partial ileus. No fevers but WBC elevated, watch for s/sx of abscess. Continue NGT and diet back to sips/chips. If diarrhea continues may need to test for C Diff. FEN -- WBC and Cr both elevated, repeat in AM VTE -- SCD's, Lovenox Dispo -- Ileus    Freeman CaldronMichael J. Jahmiya Guidotti, PA-C Pager: [phone] 04/18/2016

## 2016-04-19 ENCOUNTER — Inpatient Hospital Stay (HOSPITAL_COMMUNITY): Payer: Medicaid Other

## 2016-04-19 LAB — BASIC METABOLIC PANEL WITH GFR
Anion gap: 8 (ref 5–15)
BUN: 30 mg/dL — ABNORMAL HIGH (ref 6–20)
CO2: 24 mmol/L (ref 22–32)
Calcium: 8.4 mg/dL — ABNORMAL LOW (ref 8.9–10.3)
Chloride: 105 mmol/L (ref 101–111)
Creatinine, Ser: 1.08 mg/dL (ref 0.61–1.24)
GFR calc Af Amer: >60 mL/min
GFR calc non Af Amer: >60 mL/min
Glucose, Bld: 101 mg/dL — ABNORMAL HIGH (ref 65–99)
Potassium: 4.4 mmol/L (ref 3.5–5.1)
Sodium: 137 mmol/L (ref 135–145)

## 2016-04-19 LAB — CBC
HCT: 35.9 % — ABNORMAL LOW (ref 39.0–52.0)
Hemoglobin: 11.9 g/dL — ABNORMAL LOW (ref 13.0–17.0)
MCH: 31.2 pg (ref 26.0–34.0)
MCHC: 33.1 g/dL (ref 30.0–36.0)
MCV: 94 fL (ref 78.0–100.0)
Platelets: 400 K/uL (ref 150–400)
RBC: 3.82 MIL/uL — ABNORMAL LOW (ref 4.22–5.81)
RDW: 14 % (ref 11.5–15.5)
WBC: 10.9 K/uL — ABNORMAL HIGH (ref 4.0–10.5)

## 2016-04-19 LAB — CULTURE, BLOOD (ROUTINE X 2)
CULTURE: NO GROWTH
CULTURE: NO GROWTH

## 2016-04-19 NOTE — Progress Notes (Signed)
Pt's NGT clamped around 11 am. No c/o nausea, bloating or pain. Remains clamped. Ambulating in hall occasionally. Several loose BM's varying in color (yellow and the last green) today.

## 2016-04-19 NOTE — Progress Notes (Signed)
5 Days Post-Op  Subjective: Feels better today. Minimal pain. Has had flatus and had a bowel movement this morning.  Objective: Vital signs in last 24 hours: Temp:  [98.3 F (36.8 C)-98.4 F (36.9 C)] 98.3 F (36.8 C) (11/28 0609) Pulse Rate:  [86-91] 91 (11/28 0609) Resp:  [16-18] 16 (11/28 0609) BP: (127-142)/(81-92) 142/92 (11/28 0609) SpO2:  [94 %-96 %] 94 % (11/28 0609) Last BM Date: 04/18/16  Intake/Output from previous day: 11/27 0701 - 11/28 0700 In: 2453.3 [I.V.:2393.3; IV Piggyback:60] Out: 3405 [Urine:4; Emesis/NG output:3400; Stool:1] Intake/Output this shift: Total I/O In: 1025 [I.V.:625; NG/GT:400] Out: -   General appearance: alert, cooperative, no distress and Overall brighter then yesterday GI: normal findings: soft, non-tender and Minimal distention Incision/Wound: VAC in place. No erythema or unusual drainage  Lab Results:   Recent Labs  04/18/16 0423 04/19/16 0439  WBC 15.0* 10.9*  HGB 13.6 11.9*  HCT 40.4 35.9*  PLT 441* 400   BMET  Recent Labs  04/18/16 0423 04/19/16 0439  NA 136 137  K 3.4* 4.4  CL 102 105  CO2 21* 24  GLUCOSE 121* 101*  BUN 42* 30*  CREATININE 1.37* 1.08  CALCIUM 8.9 8.4*     Studies/Results: Dg Abd 2 Views  Result Date: 04/19/2016 CLINICAL DATA:  Postop nausea and vomiting EXAM: ABDOMEN - 2 VIEW COMPARISON:  CT 04/14/2016 FINDINGS: NG tube is in place with the tip in the distal stomach or proximal duodenum. There are dilated small bowel loops with air-fluid levels compatible with small bowel obstruction. No free air organomegaly. IMPRESSION: Dilated small bowel loops with air-fluid levels compatible with small bowel obstruction. Electronically Signed   By: Charlett NoseKevin  Dover M.D.   On: 04/19/2016 07:29    Anti-infectives: Anti-infectives    Start     Dose/Rate Route Frequency Ordered Stop   04/14/16 1730  clindamycin (CLEOCIN) 900 mg, gentamicin (GARAMYCIN) 240 mg in sodium chloride 0.9 % 1,000 mL for  intraperitoneal lavage  Status:  Discontinued      Intraperitoneal To Surgery 04/14/16 1719 04/14/16 2103   04/14/16 1545  piperacillin-tazobactam (ZOSYN) IVPB 3.375 g     3.375 g 12.5 mL/hr over 240 Minutes Intravenous Every 8 hours 04/14/16 1533 04/19/16 1159   04/14/16 1530  piperacillin-tazobactam (ZOSYN) IVPB 3.375 g     3.375 g 12.5 mL/hr over 240 Minutes Intravenous  Once 04/14/16 1529 04/14/16 1946   04/14/16 1515  Ampicillin-Sulbactam (UNASYN) 3 g in sodium chloride 0.9 % 100 mL IVPB  Status:  Discontinued     3 g 200 mL/hr over 30 Minutes Intravenous  Once 04/14/16 1514 04/14/16 1529   04/14/16 1515  Ampicillin-Sulbactam (UNASYN) 3 g in sodium chloride 0.9 % 100 mL IVPB  Status:  Discontinued     3 g 200 mL/hr over 30 Minutes Intravenous  Once 04/14/16 1514 04/14/16 1529      Assessment/Plan: s/p Procedure(s): ileocolonic resection with Meckel's diverticulectomy Overall seems much improved with less pain and distention, positive bowel function and decreasing white blood count. X-rays this morning do show dilated bowel of concern for possible bowel obstruction. However since he has had a bowel movement since it is clinically much improved we will try clamping the NG tube today to see how he does. Continue nothing by mouth. Repeat abdominal x-rays and CBC in the morning.   LOS: 5 days    Margarett Viti T 11/28/2017Patient ID: Aaron Trujillo, male   DOB: [birth year], 45 y.o.   MRN: [id number]

## 2016-04-20 ENCOUNTER — Inpatient Hospital Stay (HOSPITAL_COMMUNITY): Payer: Medicaid Other

## 2016-04-20 LAB — CBC
HCT: 36.2 % — ABNORMAL LOW (ref 39.0–52.0)
HEMOGLOBIN: 11.9 g/dL — AB (ref 13.0–17.0)
MCH: 30.9 pg (ref 26.0–34.0)
MCHC: 32.9 g/dL (ref 30.0–36.0)
MCV: 94 fL (ref 78.0–100.0)
PLATELETS: 445 10*3/uL — AB (ref 150–400)
RBC: 3.85 MIL/uL — ABNORMAL LOW (ref 4.22–5.81)
RDW: 13.6 % (ref 11.5–15.5)
WBC: 10.3 10*3/uL (ref 4.0–10.5)

## 2016-04-20 NOTE — Progress Notes (Signed)
Patient ID: Aaron Trujillo, male   DOB: 11/07/1970, 45 y.o.   MRN: [id number]  [facility]  6 Days Post-Op  Subjective: Feeling better today. Reports less abdominal distension. Denies n/v. Had multiple loose BM's.   Objective: Vital signs in last 24 hours: Temp:  [97.8 F (36.6 C)-98.7 F (37.1 C)] 97.8 F (36.6 C) (11/29 1000) Pulse Rate:  [77-82] 77 (11/29 1000) Resp:  [15-17] 15 (11/29 1000) BP: (133-142)/(82-92) 138/90 (11/29 1000) SpO2:  [93 %-97 %] 97 % (11/29 1000) Last BM Date: 04/18/16  Intake/Output from previous day: 11/28 0701 - 11/29 0700 In: 3275 [I.V.:2875; NG/GT:400] Out: 0  Intake/Output this shift: No intake/output data recorded.  PE: Gen:  Alert, NAD, pleasant Pulm:  Effort normal Abd: Soft, mild distension, nontender, +BS, wound vac to midline abdominal incision  Lab Results:   Recent Labs  04/19/16 0439 04/20/16 0503  WBC 10.9* 10.3  HGB 11.9* 11.9*  HCT 35.9* 36.2*  PLT 400 445*   BMET  Recent Labs  04/18/16 0423 04/19/16 0439  NA 136 137  K 3.4* 4.4  CL 102 105  CO2 21* 24  GLUCOSE 121* 101*  BUN 42* 30*  CREATININE 1.37* 1.08  CALCIUM 8.9 8.4*   PT/INR No results for input(s): LABPROT, INR in the last 72 hours. CMP     Component Value Date/Time   NA 137 04/19/2016 0439   K 4.4 04/19/2016 0439   CL 105 04/19/2016 0439   CO2 24 04/19/2016 0439   GLUCOSE 101 (H) 04/19/2016 0439   BUN 30 (H) 04/19/2016 0439   CREATININE 1.08 04/19/2016 0439   CALCIUM 8.4 (L) 04/19/2016 0439   PROT 8.0 04/14/2016 1105   ALBUMIN 4.7 04/14/2016 1105   AST 27 04/14/2016 1105   ALT 31 04/14/2016 1105   ALKPHOS 74 04/14/2016 1105   BILITOT 2.3 (H) 04/14/2016 1105   GFRNONAA >60 04/19/2016 0439   GFRAA >60 04/19/2016 0439   Lipase     Component Value Date/Time   LIPASE 18 04/14/2016 1105       Studies/Results: Dg Abd 2 Views  Result Date: 04/20/2016 CLINICAL DATA:  Postoperative nausea and vomiting.  EXAM: ABDOMEN - 2 VIEW COMPARISON:  Radiographs April 19, 2016. FINDINGS: Distal tip of nasogastric tube is seen in expected position of the distal stomach. Stable small bowel dilatation is noted concerning for distal small bowel obstruction. No colonic dilatation is noted. No free air is noted. IMPRESSION: Stable small bowel dilatation is noted concerning for distal small bowel obstruction. Electronically Signed   By: Lupita RaiderJames  Green Jr, M.D.   On: 04/20/2016 07:23   Dg Abd 2 Views  Result Date: 04/19/2016 CLINICAL DATA:  Postop nausea and vomiting EXAM: ABDOMEN - 2 VIEW COMPARISON:  CT 04/14/2016 FINDINGS: NG tube is in place with the tip in the distal stomach or proximal duodenum. There are dilated small bowel loops with air-fluid levels compatible with small bowel obstruction. No free air organomegaly. IMPRESSION: Dilated small bowel loops with air-fluid levels compatible with small bowel obstruction. Electronically Signed   By: Charlett NoseKevin  Dover M.D.   On: 04/19/2016 07:29    Anti-infectives: Anti-infectives    Start     Dose/Rate Route Frequency Ordered Stop   04/14/16 1730  clindamycin (CLEOCIN) 900 mg, gentamicin (GARAMYCIN) 240 mg in sodium chloride 0.9 % 1,000 mL for intraperitoneal lavage  Status:  Discontinued      Intraperitoneal To Surgery 04/14/16 1719 04/14/16 2103   04/14/16 1545  piperacillin-tazobactam (ZOSYN) IVPB 3.375 g     3.375 g 12.5 mL/hr over 240 Minutes Intravenous Every 8 hours 04/14/16 1533 04/19/16 1159   04/14/16 1530  piperacillin-tazobactam (ZOSYN) IVPB 3.375 g     3.375 g 12.5 mL/hr over 240 Minutes Intravenous  Once 04/14/16 1529 04/14/16 1946   04/14/16 1515  Ampicillin-Sulbactam (UNASYN) 3 g in sodium chloride 0.9 % 100 mL IVPB  Status:  Discontinued     3 g 200 mL/hr over 30 Minutes Intravenous  Once 04/14/16 1514 04/14/16 1529   04/14/16 1515  Ampicillin-Sulbactam (UNASYN) 3 g in sodium chloride 0.9 % 100 mL IVPB  Status:  Discontinued     3 g 200 mL/hr over  30 Minutes Intravenous  Once 04/14/16 1514 04/14/16 1529       Assessment/Plan Gangrenous appendicitis with perforation & focal peritonitis Meckel's Diverticulum S/p Lap-assisted ileocolonic resection, Omentopexy of ileocolonic anastomosis, Meckel's diverticulectomy 11/23 Dr. Michaell CowingGross - POD 6 - XR this AM: Stable small bowel dilatation is noted concerning for distal small bowel obstruction - WBC trending down and now WNL - less abdominal distension, no n/v, multiple loose BM's  FEN - clears, IVF VTE - SCD's, Lovenox  Plan - XR concerning for SBO, but clinically patient is doing better today. Will advance to clear liquid diet and see how he does. If unable to tolerate/progress diet will need to consider TNA, patient is ok with this. Continue to mobilize. Labs in AM   LOS: 6 days    Edson SnowballBROOKE A MILLER , [facility] 04/20/2016, 11:29 AM Pager: [phone] Consults: [phone] Mon-Fri 7:00 am-4:30 pm Sat-Sun 7:00 am-11:30 am

## 2016-04-21 ENCOUNTER — Inpatient Hospital Stay (HOSPITAL_COMMUNITY): Payer: Medicaid Other

## 2016-04-21 LAB — CBC
HCT: 36.6 % — ABNORMAL LOW (ref 39.0–52.0)
Hemoglobin: 11.9 g/dL — ABNORMAL LOW (ref 13.0–17.0)
MCH: 30.7 pg (ref 26.0–34.0)
MCHC: 32.5 g/dL (ref 30.0–36.0)
MCV: 94.6 fL (ref 78.0–100.0)
PLATELETS: 561 10*3/uL — AB (ref 150–400)
RBC: 3.87 MIL/uL — AB (ref 4.22–5.81)
RDW: 13.9 % (ref 11.5–15.5)
WBC: 10.6 10*3/uL — ABNORMAL HIGH (ref 4.0–10.5)

## 2016-04-21 LAB — BASIC METABOLIC PANEL
Anion gap: 7 (ref 5–15)
BUN: 20 mg/dL (ref 6–20)
CO2: 23 mmol/L (ref 22–32)
CREATININE: 0.9 mg/dL (ref 0.61–1.24)
Calcium: 8.4 mg/dL — ABNORMAL LOW (ref 8.9–10.3)
Chloride: 108 mmol/L (ref 101–111)
Glucose, Bld: 127 mg/dL — ABNORMAL HIGH (ref 65–99)
POTASSIUM: 3.9 mmol/L (ref 3.5–5.1)
SODIUM: 138 mmol/L (ref 135–145)

## 2016-04-21 LAB — PHOSPHORUS: PHOSPHORUS: 2.5 mg/dL (ref 2.5–4.6)

## 2016-04-21 LAB — MAGNESIUM: MAGNESIUM: 2.1 mg/dL (ref 1.7–2.4)

## 2016-04-21 MED ORDER — M.V.I. ADULT IV INJ
INJECTION | INTRAVENOUS | Status: AC
  Administered 2016-04-21: 20:00:00 via INTRAVENOUS
  Filled 2016-04-21: qty 960

## 2016-04-21 MED ORDER — FAT EMULSION 20 % IV EMUL
240.0000 mL | INTRAVENOUS | Status: AC
  Administered 2016-04-21: 240 mL via INTRAVENOUS
  Filled 2016-04-21: qty 250

## 2016-04-21 MED ORDER — FAT EMULSION 20 % IV EMUL
240.0000 mL | INTRAVENOUS | Status: DC
  Filled 2016-04-21: qty 250

## 2016-04-21 MED ORDER — INSULIN ASPART 100 UNIT/ML ~~LOC~~ SOLN
0.0000 [IU] | Freq: Three times a day (TID) | SUBCUTANEOUS | Status: DC
  Administered 2016-04-22 – 2016-04-23 (×6): 2 [IU] via SUBCUTANEOUS
  Administered 2016-04-24: 1 [IU] via SUBCUTANEOUS
  Administered 2016-04-24: 2 [IU] via SUBCUTANEOUS
  Administered 2016-04-24 – 2016-04-25 (×3): 1 [IU] via SUBCUTANEOUS
  Administered 2016-04-25 – 2016-04-26 (×3): 2 [IU] via SUBCUTANEOUS

## 2016-04-21 MED ORDER — SODIUM CHLORIDE 0.9% FLUSH
10.0000 mL | INTRAVENOUS | Status: DC | PRN
  Administered 2016-04-24 – 2016-05-03 (×7): 10 mL
  Filled 2016-04-21 (×7): qty 40

## 2016-04-21 MED ORDER — TRACE MINERALS CR-CU-MN-SE-ZN 10-1000-500-60 MCG/ML IV SOLN
INTRAVENOUS | Status: DC
  Filled 2016-04-21: qty 960

## 2016-04-21 NOTE — Progress Notes (Signed)
Peripherally Inserted Central Catheter/Midline Placement  The IV Nurse has discussed with the patient and/or persons authorized to consent for the patient, the purpose of this procedure and the potential benefits and risks involved with this procedure.  The benefits include less needle sticks, lab draws from the catheter, and the patient may be discharged home with the catheter. Risks include, but not limited to, infection, bleeding, blood clot (thrombus formation), and puncture of an artery; nerve damage and irregular heartbeat and possibility to perform a PICC exchange if needed/ordered by physician.  Alternatives to this procedure were also discussed.  Bard Power PICC patient education guide, fact sheet on infection prevention and patient information card has been provided to patient /or left at bedside.    PICC/Midline Placement Documentation        Aaron Trujillo, Aaron Trujillo 04/21/2016, 6:28 PM

## 2016-04-21 NOTE — Progress Notes (Signed)
Initial Nutrition Assessment  DOCUMENTATION CODES:   Not applicable  INTERVENTION:   TPN per pharmacy   NUTRITION DIAGNOSIS:   Inadequate oral intake related to altered GI function as evidenced by worsening abdominal distension after eating and the need for TPN.   GOAL:   Patient will meet greater than or equal to 90% of their needs  MONITOR:   PO intake and TPN- monitor for refeeding   REASON FOR ASSESSMENT:   Consult New TPN/TNA  ASSESSMENT:   S/p Lap-assisted ileocolonic resection, Omentopexy of ileocolonic anastomosis, Meckel's diverticulectomy 11/23. Now with SBO and abd distension that worsens after eating   Met with pt in room today. Pt reports that he is feeling better today and that he has been sipping on fluids all day. Pt reports eating some of his CL tray but states that if he drinks too much, his abdomen will become distended and then he feels too full. MD concerned about SBO. Pt is passing flatus and some loose stools. Pt has NG tube in place for suctioning that has been clamped off today. No abd pain, no N/V per pt. Pt reports stable weights, good appetite and PO intake pta. No new wt since admit. Spoke to [employer]- PA-C. Plan is to start TPN if even just for a few days until pt able to tolerate PO feedings. Pt will stay on CL diet and be allowed to sip while on TPN. Discussed Boost Breeze with PA-C, decided to wait a few days, there is concern that pt may not tolerate this.    Medications reviewed and include: lovenox, insulin   Labs reviewed: Ca 8.4(L), P 2.5 wnl, Mg 2.1 wnl  WBC-10.6(H)  Nutrition-Focused physical exam completed. Findings are no fat depletion, no muscle depletion, and no edema.   Diet Order:  Diet - low sodium heart healthy Diet clear liquid Room service appropriate? Yes; Fluid consistency: Thin  Skin:  Wound (see comment) (abd incision )  Last BM:  11/30  Height:   Ht Readings from Last 1 Encounters:  04/14/16 '5\' 11"'  (1.803 m)     Weight:   Wt Readings from Last 1 Encounters:  04/14/16 215 lb (97.5 kg)    Ideal Body Weight:     BMI:  Body mass index is 29.99 kg/m.  Estimated Nutritional Needs:   Kcal:  2000-2200kcal/day   Protein:  108-127g/day   Fluid:  >2L/day   EDUCATION NEEDS:   No education needs identified at this time  Koleen Distance, RD, LDN

## 2016-04-21 NOTE — Progress Notes (Signed)
PHARMACY - ADULT TOTAL PARENTERAL NUTRITION CONSULT NOTE   Pharmacy Consult for TPN Indication: bowel obstruction  Patient Measurements: Height: 5\' 11"  (180.3 cm) Weight: 215 lb (97.5 kg) IBW/kg (Calculated) : 75.3   Body mass index is 29.99 kg/m. Usual Weight:  Adjusted weight 84 kg  Insulin Requirements/ 24 hrs: none  Current Nutrition: Clear liquids (started 11/29)  IVF: LR @ 125 ml/hr  Central access: PICC line ordered 11/30 TPN start date: pending central line  ASSESSMENT                                                                                                          HPI: [id number] yoM presented to ED on 11/23 with abdominal pain and CT showing appendicitis with focal perforation.  Surgery consulted and found to have gangrenous appendicitis with perforation, focal peritonitis, and Meckel's Diverticulum.  S/p Lap-assisted ileocolonic resection, Omentopexy of ileocolonic anastomosis, Meckel's diverticulectomyon 11/23.  Pharmacy is now consulted to dose TPN for suspected bowel obstruction.  Significant events:   Today:   Glucose - Elevated glucose 200 noted on admission. A1c = 6.1.  Recently, CBGs have remained < 150s without insulin administration.    Electrolytes - Na, K, Mag, Phos, are WNL.  Renal - SCr improving with CrCl > 100 ml/min  LFTs - WNL (11/23)  TGs - pending  Prealbumin - pending  NUTRITIONAL GOALS                                                                                             RD recs: pending  Estimate:  2100 kcal/day, 100g protein/day  Clinimix 5/15 at a goal rate of 83 ml/hr + 20% fat emulsion at 10 ml/hr to provide: 100 g/day protein, 1894 Kcal/day. This meets 100% of protein and 90% of kCal needs  Clinimix E 5/20 2L and E 5/15 1L are currently unavailable d/t Sport and exercise psychologistnational shortage. For now, we will use Clinimix E 5/20 to begin therapy, then transition to Clinimix E 5/15 2L product when needed.    PLAN  If central line placed today, At 1800 today:  Start Clinimix E 5/20 at 8240ml/hr.  20% fat emulsion at 10710ml/hr.  Plan to advance as tolerated to the goal rate.  TPN to contain standard multivitamins and trace elements.  Reduce IVF to 85 ml/hr.  Add CBGs and sensitive SSI q8h  TPN lab panels on Mondays & Thursdays.  F/u daily, monitor for s/s refeeding syndrome.   Lynann Beaverhristine Kenidee Cregan PharmD, BCPS Pager [phone] 04/21/2016 12:48 PM

## 2016-04-21 NOTE — Progress Notes (Signed)
Paged Dr Maisie Fushomas about whether or not patient will go for an xray this morning. Awaiting response.

## 2016-04-21 NOTE — Progress Notes (Signed)
Patient ID: Aaron Trujillo, male   DOB: July 01, 1970, 45 y.o.   MRN: [id number]  [facility]  7 Days Post-Op  Subjective: Denies any abdominal pain. Persistent abdominal distension, worse with PO fluids and better after BM. He is having significant belching and some flatus. Denies any nausea or vomiting.  Objective: Vital signs in last 24 hours: Temp:  [98.2 F (36.8 C)-98.6 F (37 C)] 98.3 F (36.8 C) (11/30 0508) Pulse Rate:  [79-95] 95 (11/30 0508) Resp:  [15-18] 18 (11/30 0508) BP: (127-141)/(78-90) 141/90 (11/30 0508) SpO2:  [97 %-98 %] 97 % (11/30 0508) Last BM Date: 04/21/16  Intake/Output from previous day: 11/29 0701 - 11/30 0700 In: 2490 [I.V.:2250] Out: -  Intake/Output this shift: Total I/O In: 500 [I.V.:500] Out: -   PE: Gen:  Alert, NAD, pleasant Pulm:  CTAB, Effort normal Cardio: RRR Abd: Soft, distended, nontender, +BS, wound vac to midline abdominal incision >> removed wound vac and midline abdominal incision is healing well, no purulent drainage, tissue beefy red  Lab Results:   Recent Labs  04/20/16 0503 04/21/16 0456  WBC 10.3 10.6*  HGB 11.9* 11.9*  HCT 36.2* 36.6*  PLT 445* 561*   BMET  Recent Labs  04/19/16 0439 04/21/16 0456  NA 137 138  K 4.4 3.9  CL 105 108  CO2 24 23  GLUCOSE 101* 127*  BUN 30* 20  CREATININE 1.08 0.90  CALCIUM 8.4* 8.4*   PT/INR No results for input(s): LABPROT, INR in the last 72 hours. CMP     Component Value Date/Time   NA 138 04/21/2016 0456   K 3.9 04/21/2016 0456   CL 108 04/21/2016 0456   CO2 23 04/21/2016 0456   GLUCOSE 127 (H) 04/21/2016 0456   BUN 20 04/21/2016 0456   CREATININE 0.90 04/21/2016 0456   CALCIUM 8.4 (L) 04/21/2016 0456   PROT 8.0 04/14/2016 1105   ALBUMIN 4.7 04/14/2016 1105   AST 27 04/14/2016 1105   ALT 31 04/14/2016 1105   ALKPHOS 74 04/14/2016 1105   BILITOT 2.3 (H) 04/14/2016 1105   GFRNONAA >60 04/21/2016 0456   GFRAA >60 04/21/2016 0456    Lipase     Component Value Date/Time   LIPASE 18 04/14/2016 1105       Studies/Results: Dg Abd 1 View  Result Date: 04/21/2016 CLINICAL DATA:  Follow up small bowel obstruction EXAM: ABDOMEN - 1 VIEW COMPARISON:  04/20/2016 FINDINGS: Nasogastric catheter remains coiled within the stomach. Scattered dilated loops of small bowel are again identified N mildly improved when compared with the prior study. Colonic gas is again noted. IMPRESSION: Mild improvement in the degree of small bowel dilatation. Electronically Signed   By: Alcide CleverMark  Lukens M.D.   On: 04/21/2016 07:28   Dg Abd 2 Views  Result Date: 04/20/2016 CLINICAL DATA:  Postoperative nausea and vomiting. EXAM: ABDOMEN - 2 VIEW COMPARISON:  Radiographs April 19, 2016. FINDINGS: Distal tip of nasogastric tube is seen in expected position of the distal stomach. Stable small bowel dilatation is noted concerning for distal small bowel obstruction. No colonic dilatation is noted. No free air is noted. IMPRESSION: Stable small bowel dilatation is noted concerning for distal small bowel obstruction. Electronically Signed   By: Lupita RaiderJames  Green Jr, M.D.   On: 04/20/2016 07:23    Anti-infectives: Anti-infectives    Start     Dose/Rate Route Frequency Ordered Stop   04/14/16 1730  clindamycin (CLEOCIN) 900 mg, gentamicin (GARAMYCIN) 240 mg in sodium  chloride 0.9 % 1,000 mL for intraperitoneal lavage  Status:  Discontinued      Intraperitoneal To Surgery 04/14/16 1719 04/14/16 2103   04/14/16 1545  piperacillin-tazobactam (ZOSYN) IVPB 3.375 g     3.375 g 12.5 mL/hr over 240 Minutes Intravenous Every 8 hours 04/14/16 1533 04/19/16 1159   04/14/16 1530  piperacillin-tazobactam (ZOSYN) IVPB 3.375 g     3.375 g 12.5 mL/hr over 240 Minutes Intravenous  Once 04/14/16 1529 04/14/16 1946   04/14/16 1515  Ampicillin-Sulbactam (UNASYN) 3 g in sodium chloride 0.9 % 100 mL IVPB  Status:  Discontinued     3 g 200 mL/hr over 30 Minutes Intravenous  Once  04/14/16 1514 04/14/16 1529   04/14/16 1515  Ampicillin-Sulbactam (UNASYN) 3 g in sodium chloride 0.9 % 100 mL IVPB  Status:  Discontinued     3 g 200 mL/hr over 30 Minutes Intravenous  Once 04/14/16 1514 04/14/16 1529       Assessment/Plan Gangrenous appendicitis with perforation & focal peritonitis Meckel's Diverticulum S/p Lap-assisted ileocolonic resection, Omentopexy of ileocolonic anastomosis, Meckel's diverticulectomy 11/23 Dr. Michaell CowingGross - POD 7 - XR this AM: Mild improvement in the degree of small bowel dilatation. - WBC 10.6, afebrile, abdomen distended but nontender. No n/v with NG clamped  FEN - clears, IVF, TPN VTE- SCD's, Lovenox  Plan- XR shows mild improvement of SB dilation, patient still distended on exam but has good BS and is nontender. No n/v. +BM and flatus. Keep NG clamped and ok to sip on clears. Continue to mobilize. Repeat XR in AM Ordering PICC/TPN due to SBO and inability to tolerate any PO for 1 week. D/c wound vac and switch to BID wet to dry dressing changes. Encourage daily showers with wound open.   LOS: 7 days    Edson SnowballBROOKE A MILLER , [facility] 04/21/2016, 12:05 PM Pager: [phone] Consults: [phone] Mon-Fri 7:00 am-4:30 pm Sat-Sun 7:00 am-11:30 am

## 2016-04-22 ENCOUNTER — Inpatient Hospital Stay (HOSPITAL_COMMUNITY): Payer: Medicaid Other

## 2016-04-22 LAB — COMPREHENSIVE METABOLIC PANEL
ALT: 29 U/L (ref 17–63)
AST: 26 U/L (ref 15–41)
Albumin: 2.6 g/dL — ABNORMAL LOW (ref 3.5–5.0)
Alkaline Phosphatase: 69 U/L (ref 38–126)
Anion gap: 8 (ref 5–15)
BUN: 16 mg/dL (ref 6–20)
CHLORIDE: 103 mmol/L (ref 101–111)
CO2: 25 mmol/L (ref 22–32)
CREATININE: 0.86 mg/dL (ref 0.61–1.24)
Calcium: 8.2 mg/dL — ABNORMAL LOW (ref 8.9–10.3)
Glucose, Bld: 160 mg/dL — ABNORMAL HIGH (ref 65–99)
POTASSIUM: 3.5 mmol/L (ref 3.5–5.1)
Sodium: 136 mmol/L (ref 135–145)
Total Bilirubin: 1 mg/dL (ref 0.3–1.2)
Total Protein: 6.7 g/dL (ref 6.5–8.1)

## 2016-04-22 LAB — GLUCOSE, CAPILLARY
GLUCOSE-CAPILLARY: 156 mg/dL — AB (ref 65–99)
GLUCOSE-CAPILLARY: 169 mg/dL — AB (ref 65–99)
GLUCOSE-CAPILLARY: 188 mg/dL — AB (ref 65–99)
Glucose-Capillary: 191 mg/dL — ABNORMAL HIGH (ref 65–99)

## 2016-04-22 LAB — CBC
HCT: 36.1 % — ABNORMAL LOW (ref 39.0–52.0)
Hemoglobin: 12 g/dL — ABNORMAL LOW (ref 13.0–17.0)
MCH: 31.2 pg (ref 26.0–34.0)
MCHC: 33.2 g/dL (ref 30.0–36.0)
MCV: 93.8 fL (ref 78.0–100.0)
PLATELETS: 619 10*3/uL — AB (ref 150–400)
RBC: 3.85 MIL/uL — ABNORMAL LOW (ref 4.22–5.81)
RDW: 13.7 % (ref 11.5–15.5)
WBC: 13 10*3/uL — AB (ref 4.0–10.5)

## 2016-04-22 LAB — DIFFERENTIAL
BASOS PCT: 0 %
Basophils Absolute: 0 10*3/uL (ref 0.0–0.1)
EOS ABS: 0.1 10*3/uL (ref 0.0–0.7)
EOS PCT: 1 %
Lymphocytes Relative: 17 %
Lymphs Abs: 2.3 10*3/uL (ref 0.7–4.0)
MONO ABS: 1.1 10*3/uL — AB (ref 0.1–1.0)
Monocytes Relative: 8 %
NEUTROS ABS: 9.6 10*3/uL — AB (ref 1.7–7.7)
Neutrophils Relative %: 74 %

## 2016-04-22 LAB — MAGNESIUM: MAGNESIUM: 2.1 mg/dL (ref 1.7–2.4)

## 2016-04-22 LAB — PHOSPHORUS: Phosphorus: 2.3 mg/dL — ABNORMAL LOW (ref 2.5–4.6)

## 2016-04-22 LAB — PREALBUMIN: PREALBUMIN: 12.1 mg/dL — AB (ref 18–38)

## 2016-04-22 LAB — TRIGLYCERIDES: Triglycerides: 169 mg/dL — ABNORMAL HIGH (ref <150)

## 2016-04-22 MED ORDER — POTASSIUM PHOSPHATES 15 MMOLE/5ML IV SOLN
15.0000 mmol | Freq: Once | INTRAVENOUS | Status: AC
  Administered 2016-04-22: 15 mmol via INTRAVENOUS
  Filled 2016-04-22: qty 5

## 2016-04-22 MED ORDER — SODIUM CHLORIDE 0.9% FLUSH
3.0000 mL | Freq: Two times a day (BID) | INTRAVENOUS | Status: DC
  Administered 2016-04-23 – 2016-04-29 (×4): 3 mL via INTRAVENOUS

## 2016-04-22 MED ORDER — TRACE MINERALS CR-CU-MN-SE-ZN 10-1000-500-60 MCG/ML IV SOLN
INTRAVENOUS | Status: AC
  Administered 2016-04-22: 18:00:00 via INTRAVENOUS
  Filled 2016-04-22: qty 1992

## 2016-04-22 MED ORDER — ACETAMINOPHEN 500 MG PO TABS
1000.0000 mg | ORAL_TABLET | Freq: Three times a day (TID) | ORAL | Status: DC
  Administered 2016-04-22 – 2016-04-30 (×16): 1000 mg via ORAL
  Filled 2016-04-22 (×23): qty 2

## 2016-04-22 MED ORDER — FENTANYL CITRATE (PF) 100 MCG/2ML IJ SOLN
25.0000 ug | INTRAMUSCULAR | Status: DC | PRN
  Administered 2016-04-25: 25 ug via INTRAVENOUS
  Filled 2016-04-22: qty 2

## 2016-04-22 MED ORDER — FUROSEMIDE 20 MG PO TABS
40.0000 mg | ORAL_TABLET | Freq: Two times a day (BID) | ORAL | Status: DC
  Administered 2016-04-22 – 2016-05-03 (×22): 40 mg via ORAL
  Filled 2016-04-22 (×22): qty 2

## 2016-04-22 MED ORDER — SODIUM CHLORIDE 0.9 % IV SOLN
250.0000 mL | INTRAVENOUS | Status: DC | PRN
Start: 2016-04-22 — End: 2016-04-30

## 2016-04-22 MED ORDER — METHOCARBAMOL 500 MG PO TABS
1000.0000 mg | ORAL_TABLET | Freq: Four times a day (QID) | ORAL | Status: DC | PRN
  Administered 2016-04-25: 1000 mg via ORAL
  Filled 2016-04-22 (×2): qty 2

## 2016-04-22 MED ORDER — LACTATED RINGERS IV SOLN
INTRAVENOUS | Status: DC
  Administered 2016-04-22 – 2016-04-25 (×2): via INTRAVENOUS

## 2016-04-22 MED ORDER — FAT EMULSION 20 % IV EMUL
240.0000 mL | INTRAVENOUS | Status: AC
  Administered 2016-04-22: 240 mL via INTRAVENOUS
  Filled 2016-04-22: qty 240

## 2016-04-22 MED ORDER — SODIUM CHLORIDE 0.9% FLUSH
3.0000 mL | INTRAVENOUS | Status: DC | PRN
  Administered 2016-04-29: 3 mL via INTRAVENOUS
  Filled 2016-04-22: qty 3

## 2016-04-22 MED ORDER — GLUCERNA SHAKE PO LIQD
237.0000 mL | Freq: Two times a day (BID) | ORAL | Status: DC
  Administered 2016-04-22 – 2016-04-27 (×5): 237 mL via ORAL
  Filled 2016-04-22 (×12): qty 237

## 2016-04-22 NOTE — Progress Notes (Signed)
Adm 11/23 Aaron Trujillo 45 in rm 1527, dx appendicitis Perf with Peritonitis s/p ileocolectomy. NGT removed 11am. Vomited 630cc 2045.  Notified On call MD Dr. Sid FalconGerkins.  No new orders as zofran had been given after/vomiting nausea.  Patient feels better and resting comfortably.

## 2016-04-22 NOTE — Progress Notes (Signed)
[address] [place], [place] [address] Phone: [phone] FAX: [place] [phone] 05-26-1970  CARE TEAM:  PCP: No PCP Per Patient  Outpatient Care Team: Patient Care Team: No Pcp Per Patient as PCP - General (General Practice) Michael Boston, MD as Consulting Physician (General Surgery)  Inpatient Treatment Team: Treatment Team: Attending Provider: Nolon Nations, MD; Rounding Team: Md Edison Pace, MD; Technician: Etheleen Sia, NT; Registered Nurse: Celedonio Savage, RN; Technician: Sueanne Margarita, NT; Registered Nurse: Bailey Mech, RN  Problem List:   Principal Problem:   Acute gangrenous appendicitis with perforation and peritonitis s/p ileocolectomy 04/14/2016 Active Problems:   Hyperglycemia   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  Gangrenous with perforation.  Right-sided abdominal peritonitis.  Meckel's diverticulum at mid ileum with atypical tip appearance.  Resected.   Assessment  ILEUS resolving gradually.  Plan:  Tol nasogastric tube clamping trial.  I d/c'd NGT.  Adv to fulls  Wean TNA if tolerates PO better  Try diuresis. +13L overall  Follow off antibiotics.  CT scan r/o abscess if worse.  Hemoglobin A1c mildly elevated.  Most likely c/w prediabetes.    Pain control.  VTE prophylaxis- SCDs, etc  Mobilize as tolerated to help recovery  Adin Hector, M.D., F.A.C.S. Gastrointestinal and Minimally Invasive Surgery [facility] [address], [place], [place]  [address] [phone] Main / Paging   04/22/2016  Subjective:  Pain controlled Walking a lot in hallways Tol clears w/o nausea/emesis Less diarrhea Wife at bedside.  Objective:  Vital signs:  Vitals:   04/21/16 0508 04/21/16 1321 04/21/16 2030 04/22/16 0545  BP: (!) 141/90 (!) 147/94 (!) 154/89 (!) 143/86  Pulse: 95 90 86 75  Resp: _0 Temp: 98.3 F (36.8 C) 97.9 F (36.6 C) 98 F (36.7 C) 97.5 F (36.4 C)  TempSrc: Oral Oral Oral Oral  SpO2: 97% 95% 95% 96%  Weight:      Height:        Last BM Date: 04/22/16 (liquid)  Intake/Output   Yesterday:  11/30 0701 - 12/01 0700 In: 2007.5 [I.V.:2007.5] Out: 400 [Urine:400] This shift:  Total I/O In: 120 [P.O.:120] Out: -   Bowel function:  Flatus: YES  BM:  No  Drain: NG tube with thin bilious output   Physical Exam:  General: Pt awake/alert/oriented x4 in No acute distress Eyes: PERRL, normal EOM.  Sclera clear.  No icterus Neuro: CN II-XII intact w/o focal sensory/motor deficits. Lymph: No head/neck/groin lymphadenopathy Psych:  No delerium/psychosis/paranoia HENT: Normocephalic, Mucus membranes moist.  No thrush.  NGT in place Neck: Supple, No tracheal deviation Chest: No chest wall pain w good excursion CV:  Pulses intact.  Regular rhythm MS: Normal AROM mjr joints.  No obvious deformity Abdomen: Soft.  Mildy distended.  Mildly tender at incisions only.  Wound vac clean.  No evidence of peritonitis.  No incarcerated hernias. Ext:  SCDs BLE.  No mjr edema.  No cyanosis Skin: No petechiae / purpura  Results:   Diagnosis 1. Colon, segmental resection, ileocecal region ACUTE APPENDICITIS WITH PERFORATION AND PERIAPPENDICEAL ABSCESS BENIGN ILEOCECAL  INTESTINAL MUCOSA FIVE BENIGN LYMPH NODES 2. Small intestine, diverticulum, Meckel's diverticulum SMALL BOWEL MUCOSA WITH GASTRIC HETEROTOPIA, CONSISTENT WITH MECKEL'S DIVERTICULUM Casimer Lanius MD Pathologist, Electronic Signature (Case  signed 04/18/2016) Specimen Ibrohim Simmers and Clinical Information Specimen(s) Obtained: 1. Colon, segmental resection, ileocecal region 2. Small intestine, diverticulum, Meckel's diverticulum Specimen Clinical Information 1. perforated appendix [rd] Tashawna Thom 1. The specimen is received in formalin and consists of a segment of terminal ileum and right colon, with attached yellow brown, focally indurated adipose tissue. The terminal ileum measures 14.5 cm in length and the right colon measures 13.5 cm in length. The serosal surface is gray purple, hyperemic and dusky. The full thickness perforation within the right colon is noted, measuring 6.5 cm from the distal resection margin. The exposed mucosa is gray-purple and dull. The appendix is identified, and measures approximately 9.0 cm in length x 1.1 cm in diameter. The serosal surface is gray brown, dusky with diffuse tan white exudate. Perforations are noted with brown fecal material, 7.0 cm from the insertion. Sectioning the appendix reveals a red brown, hemorrhagic mucosa, and the wall measures 0.4 cm in thickness and is red brown and hemorrhagic. The lumen is mildly dilated and filled with an abundant amount of tan brown fecal material. The lumen of the intestine is filled with a moderate amount of tan brown partially digested material. The mucosa is gray pink, dusky with normal mucosal folding. No mucosal lesions are grossly identified. Several red purple, slightly enlarged and softened lymph nodes are identified within the surrounding adipose tissue, measuring up to 0.9 cm in greatest dimension. Representative sections are submitted in 1 of 2 Duplicate copy FINAL for LEVIATHAN, MACERA ([id number]) Zamani Crocker(continued) eight cassettes. A, B = appendix C = proximal resection margin D = distal resection margin E = colon perforation F = dusky mucosa G, H = representative sections of lymph nodes. 2. The specimen is received in formalin and consists  of a pouch of intestinal tissue measuring 3.2 cm in length x 1.8 cm in diameter. The proximal end is open, and the serosa is tan-gray and hyperemic with attached adipose tissue. The mucosa is tan yellow with normal folding. A representative cross section is submitted in one cassette. (KL:kh 04-15-16) Report signed out from the following location(s) Technical Component was performed at [facility] [place], [place], [place] [address]. CLIA#: [id number], Interpretation was performed at [facility] [place], [address] [place] [address], [place] [address]. CLIA #: [id number], 2 of  Labs: Results for orders placed or performed during the hospital encounter of 04/14/16 (from the past 48 hour(s))  CBC     Status: Abnormal   Collection Time: 04/21/16  4:56 AM  Result Value Ref Range   WBC 10.6 (H) 4.0 - 10.5 K/uL   RBC 3.87 (L) 4.22 - 5.81 MIL/uL   Hemoglobin 11.9 (L) 13.0 - 17.0 g/dL   HCT 36.6 (L) 39.0 - 52.0 %   MCV 94.6 78.0 - 100.0 fL   MCH 30.7 26.0 - 34.0 pg   MCHC 32.5 30.0 - 36.0 g/dL   RDW 13.9 11.5 - 15.5 %   Platelets 561 (H) 150 - 400 K/uL  Basic metabolic panel     Status: Abnormal   Collection Time: 04/21/16  4:56 AM  Result Value Ref Range   Sodium 138 135 - 145 mmol/L   Potassium 3.9 3.5 - 5.1 mmol/L   Chloride 108 101 - 111 mmol/L   CO2 23 22 - 32 mmol/L  Glucose, Bld 127 (H) 65 - 99 mg/dL   BUN 20 6 - 20 mg/dL   Creatinine, Ser 0.90 0.61 - 1.24 mg/dL   Calcium 8.4 (L) 8.9 - 10.3 mg/dL   GFR calc non Af Amer >60 >60 mL/min   GFR calc Af Amer >60 >60 mL/min    Comment: (NOTE) The eGFR has been calculated using the CKD EPI equation. This calculation has not been validated in all clinical situations. eGFR's persistently <60 mL/min signify possible Chronic Kidney Disease.    Anion gap 7 5 - 15  Magnesium     Status: None   Collection Time: 04/21/16  4:56 AM  Result Value Ref Range   Magnesium 2.1 1.7 - 2.4 mg/dL  Phosphorus      Status: None   Collection Time: 04/21/16  4:56 AM  Result Value Ref Range   Phosphorus 2.5 2.5 - 4.6 mg/dL  Comprehensive metabolic panel     Status: Abnormal   Collection Time: 04/22/16  4:24 AM  Result Value Ref Range   Sodium 136 135 - 145 mmol/L   Potassium 3.5 3.5 - 5.1 mmol/L   Chloride 103 101 - 111 mmol/L   CO2 25 22 - 32 mmol/L   Glucose, Bld 160 (H) 65 - 99 mg/dL   BUN 16 6 - 20 mg/dL   Creatinine, Ser 0.86 0.61 - 1.24 mg/dL   Calcium 8.2 (L) 8.9 - 10.3 mg/dL   Total Protein 6.7 6.5 - 8.1 g/dL   Albumin 2.6 (L) 3.5 - 5.0 g/dL   AST 26 15 - 41 U/L   ALT 29 17 - 63 U/L   Alkaline Phosphatase 69 38 - 126 U/L   Total Bilirubin 1.0 0.3 - 1.2 mg/dL   GFR calc non Af Amer >60 >60 mL/min   GFR calc Af Amer >60 >60 mL/min    Comment: (NOTE) The eGFR has been calculated using the CKD EPI equation. This calculation has not been validated in all clinical situations. eGFR's persistently <60 mL/min signify possible Chronic Kidney Disease.    Anion gap 8 5 - 15  Prealbumin     Status: Abnormal   Collection Time: 04/22/16  4:24 AM  Result Value Ref Range   Prealbumin 12.1 (L) 18 - 38 mg/dL    Comment: Performed at [facility]  Magnesium     Status: None   Collection Time: 04/22/16  4:24 AM  Result Value Ref Range   Magnesium 2.1 1.7 - 2.4 mg/dL  Phosphorus     Status: Abnormal   Collection Time: 04/22/16  4:24 AM  Result Value Ref Range   Phosphorus 2.3 (L) 2.5 - 4.6 mg/dL  Triglycerides     Status: Abnormal   Collection Time: 04/22/16  4:24 AM  Result Value Ref Range   Triglycerides 169 (H) <150 mg/dL    Comment: Performed at [facility]  CBC     Status: Abnormal   Collection Time: 04/22/16  4:24 AM  Result Value Ref Range   WBC 13.0 (H) 4.0 - 10.5 K/uL   RBC 3.85 (L) 4.22 - 5.81 MIL/uL   Hemoglobin 12.0 (L) 13.0 - 17.0 g/dL   HCT 36.1 (L) 39.0 - 52.0 %   MCV 93.8 78.0 - 100.0 fL   MCH 31.2 26.0 - 34.0 pg   MCHC 33.2 30.0 - 36.0 g/dL   RDW 13.7  11.5 - 15.5 %   Platelets 619 (H) 150 - 400 K/uL  Differential  Status: Abnormal   Collection Time: 04/22/16  4:24 AM  Result Value Ref Range   Neutrophils Relative % 74 %   Neutro Abs 9.6 (H) 1.7 - 7.7 K/uL   Lymphocytes Relative 17 %   Lymphs Abs 2.3 0.7 - 4.0 K/uL   Monocytes Relative 8 %   Monocytes Absolute 1.1 (H) 0.1 - 1.0 K/uL   Eosinophils Relative 1 %   Eosinophils Absolute 0.1 0.0 - 0.7 K/uL   Basophils Relative 0 %   Basophils Absolute 0.0 0.0 - 0.1 K/uL  Glucose, capillary     Status: Abnormal   Collection Time: 04/22/16  7:35 AM  Result Value Ref Range   Glucose-Capillary 156 (H) 65 - 99 mg/dL    Imaging / Studies: Dg Abd 1 View  Result Date: 04/22/2016 CLINICAL DATA:  Small bowel obstruction. EXAM: ABDOMEN - 1 VIEW COMPARISON:  Radiograph of April 21, 2016. FINDINGS: Nasogastric tube tip is seen in expected position of distal stomach. Mildly dilated small bowel loops are seen in left side of the abdomen which are slightly improved compared to prior exam, and may represent improving ileus or possibly obstruction. No calcifications are noted. IMPRESSION: Mildly dilated small bowel loops are noted in left side of abdomen which are slightly improved compared to prior exam, suggesting improving ileus or possibly improving distal small bowel obstruction. Electronically Signed   By: Marijo Conception, M.D.   On: 04/22/2016 07:52   Dg Abd 1 View  Result Date: 04/21/2016 CLINICAL DATA:  Follow up small bowel obstruction EXAM: ABDOMEN - 1 VIEW COMPARISON:  04/20/2016 FINDINGS: Nasogastric catheter remains coiled within the stomach. Scattered dilated loops of small bowel are again identified N mildly improved when compared with the prior study. Colonic gas is again noted. IMPRESSION: Mild improvement in the degree of small bowel dilatation. Electronically Signed   By: Inez Catalina M.D.   On: 04/21/2016 07:28    Medications / Allergies: per  chart  Antibiotics: Anti-infectives    Start     Dose/Rate Route Frequency Ordered Stop   04/14/16 1730  clindamycin (CLEOCIN) 900 mg, gentamicin (GARAMYCIN) 240 mg in sodium chloride 0.9 % 1,000 mL for intraperitoneal lavage  Status:  Discontinued      Intraperitoneal To Surgery 04/14/16 1719 04/14/16 2103   04/14/16 1545  piperacillin-tazobactam (ZOSYN) IVPB 3.375 g     3.375 g 12.5 mL/hr over 240 Minutes Intravenous Every 8 hours 04/14/16 1533 04/19/16 1159   04/14/16 1530  piperacillin-tazobactam (ZOSYN) IVPB 3.375 g     3.375 g 12.5 mL/hr over 240 Minutes Intravenous  Once 04/14/16 1529 04/14/16 1946   04/14/16 1515  Ampicillin-Sulbactam (UNASYN) 3 g in sodium chloride 0.9 % 100 mL IVPB  Status:  Discontinued     3 g 200 mL/hr over 30 Minutes Intravenous  Once 04/14/16 1514 04/14/16 1529   04/14/16 1515  Ampicillin-Sulbactam (UNASYN) 3 g in sodium chloride 0.9 % 100 mL IVPB  Status:  Discontinued     3 g 200 mL/hr over 30 Minutes Intravenous  Once 04/14/16 1514 04/14/16 1529        Note: Portions of this report may have been transcribed using voice recognition software. Every effort was made to ensure accuracy; however, inadvertent computerized transcription errors may be present.   Any transcriptional errors that result from this process are unintentional.     Adin Hector, M.D., F.A.C.S. Gastrointestinal and Minimally Invasive Surgery [facility] [address], [place], [place]  [address] [phone] Main / Paging   04/22/2016

## 2016-04-22 NOTE — Progress Notes (Signed)
PHARMACY - ADULT TOTAL PARENTERAL NUTRITION CONSULT NOTE   Pharmacy Consult for TPN Indication: bowel obstruction  Patient Measurements: Height: 5\' 11"  (180.3 cm) Weight: 215 lb (97.5 kg) IBW/kg (Calculated) : 75.3   Body mass index is 29.99 kg/m. Usual Weight:  Adjusted weight 84 kg  Insulin Requirements/ 24 hrs: 4 units sensitive SSI  Current Nutrition: Clear liquids (started 11/29)  IVF: LR @ 85 ml/hr  Central access: PICC line ordered 11/30 TPN start date: 11/30  ASSESSMENT                                                                                                          HPI: [id number] yoM presented to ED on 11/23 with abdominal pain and CT showing appendicitis with focal perforation.  Surgery consulted and found to have gangrenous appendicitis with perforation, focal peritonitis, and Meckel's Diverticulum.  S/p Lap-assisted ileocolonic resection, Omentopexy of ileocolonic anastomosis, Meckel's diverticulectomyon 11/23.  Pharmacy is now consulted to dose TPN for suspected bowel obstruction.  Significant events:  12/1 Dr. Johna SheriffHoxworth reports that patient is tolerating small amounts of liquids, but will likely need TPN nutrition support for several days.  Today:   Glucose - Elevated glucose 200 noted on admission; A1c = 6.1.  Most recent CBGs have remained < 150s.  Electrolytes - Na, Mag, CorrCa 9.3 are WNL.  K 3.5, Phos 2.3 will be replaced.  Renal - SCr improving with CrCl > 100 ml/min  LFTs - WNL (12/1)  TGs - 169 (12/1)  Prealbumin - 12.1 (12/1)  NUTRITIONAL GOALS                                                                                             RD recs: 2000-2200 kcal/day, 108-127 g protein/day, > 2L fluid/day  Clinimix 5/15 at a goal rate of 83 ml/hr + 20% fat emulsion at 10 ml/hr to provide: 100 g/day protein, 1894 Kcal/day. This meets 93% of minimum protein and 95% of minimum kCal needs  Clinimix E 5/20 2L and E 5/15 1L are currently unavailable d/t  Sport and exercise psychologistnational shortage. Clinimix E 5/20 will be used to begin therapy (1L bag), then transition to Clinimix E 5/15 (2L bags) when needed.    PLAN  Now:  Kphos 15 mmol IV x1 dose (also provides 22 mEq K+)  At 1800 today:  Increase to Clinimix E 5/15 at 83 ml/hr.  Continue 20% fat emulsion at 2810ml/hr.  Plan to advance as tolerated to the goal rate.  TPN to contain standard multivitamins and trace elements.  Reduce IVF to 10 ml/hr.  Continue CBGs and sensitive SSI q8h  TPN lab panels on Mondays & Thursdays.  F/u daily, monitor for s/s refeeding syndrome.  Recheck magnesium and phosphorus 12/2, recheck BMET 12/2 and 12/3.   Lynann Beaverhristine Samanthajo Payano PharmD, BCPS Pager [phone] 04/22/2016 7:38 AM

## 2016-04-23 ENCOUNTER — Inpatient Hospital Stay (HOSPITAL_COMMUNITY): Payer: Medicaid Other

## 2016-04-23 LAB — GLUCOSE, CAPILLARY
GLUCOSE-CAPILLARY: 183 mg/dL — AB (ref 65–99)
GLUCOSE-CAPILLARY: 165 mg/dL — AB (ref 65–99)
Glucose-Capillary: 145 mg/dL — ABNORMAL HIGH (ref 65–99)

## 2016-04-23 LAB — BASIC METABOLIC PANEL
ANION GAP: 10 (ref 5–15)
BUN: 16 mg/dL (ref 6–20)
CALCIUM: 8.6 mg/dL — AB (ref 8.9–10.3)
CHLORIDE: 100 mmol/L — AB (ref 101–111)
CO2: 26 mmol/L (ref 22–32)
CREATININE: 0.78 mg/dL (ref 0.61–1.24)
GFR calc non Af Amer: >60 mL/min (ref >60)
GLUCOSE: 169 mg/dL — AB (ref 65–99)
Potassium: 3.5 mmol/L (ref 3.5–5.1)
Sodium: 136 mmol/L (ref 135–145)

## 2016-04-23 LAB — MAGNESIUM: Magnesium: 2.3 mg/dL (ref 1.7–2.4)

## 2016-04-23 LAB — PHOSPHORUS: Phosphorus: 2.9 mg/dL (ref 2.5–4.6)

## 2016-04-23 MED ORDER — TRACE MINERALS CR-CU-MN-SE-ZN 10-1000-500-60 MCG/ML IV SOLN
INTRAVENOUS | Status: AC
  Administered 2016-04-23: 18:00:00 via INTRAVENOUS
  Filled 2016-04-23: qty 1992

## 2016-04-23 MED ORDER — FAT EMULSION 20 % IV EMUL
240.0000 mL | INTRAVENOUS | Status: AC
  Administered 2016-04-23: 240 mL via INTRAVENOUS
  Filled 2016-04-23: qty 250

## 2016-04-23 NOTE — Progress Notes (Signed)
Patient ID: Aaron Trujillo, male   DOB: April 26, 1971, 45 y.o.   MRN: [id number] [facility] Surgery Progress Note:   9 Days Post-Op  Subjective: Mental status is clear but fatigued Objective: Vital signs in last 24 hours: Temp:  [98.4 F (36.9 C)-99.4 F (37.4 C)] 98.5 F (36.9 C) (12/02 0558) Pulse Rate:  [85-111] 111 (12/02 0558) Resp:  [14-16] 16 (12/02 0558) BP: (133-147)/(91-100) 135/100 (12/02 0558) SpO2:  [96 %] 96 % (12/02 0558)  Intake/Output from previous day: 12/01 0701 - 12/02 0700 In: 2494.3 [P.O.:600; I.V.:1639.3; IV Piggyback:255] Out: 1250 [Urine:600; Emesis/NG output:650] Intake/Output this shift: Total I/O In: 412 [I.V.:412] Out: 150 [Urine:150]  Physical Exam: Work of breathing is normal.  Wound being packed.  VAC off.  Not much flatus.  Feels very bloated and distended.    Lab Results:  Results for orders placed or performed during the hospital encounter of 04/14/16 (from the past 48 hour(s))  Glucose, capillary     Status: Abnormal   Collection Time: 04/21/16 11:52 PM  Result Value Ref Range   Glucose-Capillary 191 (H) 65 - 99 mg/dL   Comment 1 QC Due   Comprehensive metabolic panel     Status: Abnormal   Collection Time: 04/22/16  4:24 AM  Result Value Ref Range   Sodium 136 135 - 145 mmol/L   Potassium 3.5 3.5 - 5.1 mmol/L   Chloride 103 101 - 111 mmol/L   CO2 25 22 - 32 mmol/L   Glucose, Bld 160 (H) 65 - 99 mg/dL   BUN 16 6 - 20 mg/dL   Creatinine, Ser 0.86 0.61 - 1.24 mg/dL   Calcium 8.2 (L) 8.9 - 10.3 mg/dL   Total Protein 6.7 6.5 - 8.1 g/dL   Albumin 2.6 (L) 3.5 - 5.0 g/dL   AST 26 15 - 41 U/L   ALT 29 17 - 63 U/L   Alkaline Phosphatase 69 38 - 126 U/L   Total Bilirubin 1.0 0.3 - 1.2 mg/dL   GFR calc non Af Amer >60 >60 mL/min   GFR calc Af Amer >60 >60 mL/min    Comment: (NOTE) The eGFR has been calculated using the CKD EPI equation. This calculation has not been validated in all clinical situations. eGFR's persistently <60 mL/min  signify possible Chronic Kidney Disease.    Anion gap 8 5 - 15  Prealbumin     Status: Abnormal   Collection Time: 04/22/16  4:24 AM  Result Value Ref Range   Prealbumin 12.1 (L) 18 - 38 mg/dL    Comment: Performed at [facility]  Magnesium     Status: None   Collection Time: 04/22/16  4:24 AM  Result Value Ref Range   Magnesium 2.1 1.7 - 2.4 mg/dL  Phosphorus     Status: Abnormal   Collection Time: 04/22/16  4:24 AM  Result Value Ref Range   Phosphorus 2.3 (L) 2.5 - 4.6 mg/dL  Triglycerides     Status: Abnormal   Collection Time: 04/22/16  4:24 AM  Result Value Ref Range   Triglycerides 169 (H) <150 mg/dL    Comment: Performed at [facility]  CBC     Status: Abnormal   Collection Time: 04/22/16  4:24 AM  Result Value Ref Range   WBC 13.0 (H) 4.0 - 10.5 K/uL   RBC 3.85 (L) 4.22 - 5.81 MIL/uL   Hemoglobin 12.0 (L) 13.0 - 17.0 g/dL   HCT 36.1 (L) 39.0 - 52.0 %   MCV  93.8 78.0 - 100.0 fL   MCH 31.2 26.0 - 34.0 pg   MCHC 33.2 30.0 - 36.0 g/dL   RDW 13.7 11.5 - 15.5 %   Platelets 619 (H) 150 - 400 K/uL  Differential     Status: Abnormal   Collection Time: 04/22/16  4:24 AM  Result Value Ref Range   Neutrophils Relative % 74 %   Neutro Abs 9.6 (H) 1.7 - 7.7 K/uL   Lymphocytes Relative 17 %   Lymphs Abs 2.3 0.7 - 4.0 K/uL   Monocytes Relative 8 %   Monocytes Absolute 1.1 (H) 0.1 - 1.0 K/uL   Eosinophils Relative 1 %   Eosinophils Absolute 0.1 0.0 - 0.7 K/uL   Basophils Relative 0 %   Basophils Absolute 0.0 0.0 - 0.1 K/uL  Glucose, capillary     Status: Abnormal   Collection Time: 04/22/16  7:35 AM  Result Value Ref Range   Glucose-Capillary 156 (H) 65 - 99 mg/dL  Glucose, capillary     Status: Abnormal   Collection Time: 04/22/16  4:44 PM  Result Value Ref Range   Glucose-Capillary 169 (H) 65 - 99 mg/dL  Glucose, capillary     Status: Abnormal   Collection Time: 04/22/16 11:50 PM  Result Value Ref Range   Glucose-Capillary 188 (H) 65 - 99 mg/dL   Phosphorus     Status: None   Collection Time: 04/23/16  3:47 AM  Result Value Ref Range   Phosphorus 2.9 2.5 - 4.6 mg/dL  Basic metabolic panel     Status: Abnormal   Collection Time: 04/23/16  3:47 AM  Result Value Ref Range   Sodium 136 135 - 145 mmol/L   Potassium 3.5 3.5 - 5.1 mmol/L   Chloride 100 (L) 101 - 111 mmol/L   CO2 26 22 - 32 mmol/L   Glucose, Bld 169 (H) 65 - 99 mg/dL   BUN 16 6 - 20 mg/dL   Creatinine, Ser 0.78 0.61 - 1.24 mg/dL   Calcium 8.6 (L) 8.9 - 10.3 mg/dL   GFR calc non Af Amer >60 >60 mL/min   GFR calc Af Amer >60 >60 mL/min    Comment: (NOTE) The eGFR has been calculated using the CKD EPI equation. This calculation has not been validated in all clinical situations. eGFR's persistently <60 mL/min signify possible Chronic Kidney Disease.    Anion gap 10 5 - 15  Magnesium     Status: None   Collection Time: 04/23/16  3:47 AM  Result Value Ref Range   Magnesium 2.3 1.7 - 2.4 mg/dL  Glucose, capillary     Status: Abnormal   Collection Time: 04/23/16  8:31 AM  Result Value Ref Range   Glucose-Capillary 183 (H) 65 - 99 mg/dL    Radiology/Results: Dg Abd 1 View  Result Date: 04/22/2016 CLINICAL DATA:  Small bowel obstruction. EXAM: ABDOMEN - 1 VIEW COMPARISON:  Radiograph of April 21, 2016. FINDINGS: Nasogastric tube tip is seen in expected position of distal stomach. Mildly dilated small bowel loops are seen in left side of the abdomen which are slightly improved compared to prior exam, and may represent improving ileus or possibly obstruction. No calcifications are noted. IMPRESSION: Mildly dilated small bowel loops are noted in left side of abdomen which are slightly improved compared to prior exam, suggesting improving ileus or possibly improving distal small bowel obstruction. Electronically Signed   By: Marijo Conception, M.D.   On: 04/22/2016 07:52    Anti-infectives: Anti-infectives  Start     Dose/Rate Route Frequency Ordered Stop   04/14/16  1730  clindamycin (CLEOCIN) 900 mg, gentamicin (GARAMYCIN) 240 mg in sodium chloride 0.9 % 1,000 mL for intraperitoneal lavage  Status:  Discontinued      Intraperitoneal To Surgery 04/14/16 1719 04/14/16 2103   04/14/16 1545  piperacillin-tazobactam (ZOSYN) IVPB 3.375 g     3.375 g 12.5 mL/hr over 240 Minutes Intravenous Every 8 hours 04/14/16 1533 04/19/16 1159   04/14/16 1530  piperacillin-tazobactam (ZOSYN) IVPB 3.375 g     3.375 g 12.5 mL/hr over 240 Minutes Intravenous  Once 04/14/16 1529 04/14/16 1946   04/14/16 1515  Ampicillin-Sulbactam (UNASYN) 3 g in sodium chloride 0.9 % 100 mL IVPB  Status:  Discontinued     3 g 200 mL/hr over 30 Minutes Intravenous  Once 04/14/16 1514 04/14/16 1529   04/14/16 1515  Ampicillin-Sulbactam (UNASYN) 3 g in sodium chloride 0.9 % 100 mL IVPB  Status:  Discontinued     3 g 200 mL/hr over 30 Minutes Intravenous  Once 04/14/16 1514 04/14/16 1529      Assessment/Plan: Problem List: Patient Active Problem List   Diagnosis Date Noted  . Pre-diabetes 04/16/2016  . Acute gangrenous appendicitis with perforation and peritonitis s/p ileocolectomy 04/14/2016 04/14/2016  . Hyperglycemia 04/14/2016  . Meckels diverticulum s/p diverticulectomy 04/14/2016 04/14/2016    Ileus postop on TNA.  Keep npo for now 9 Days Post-Op    LOS: 9 days   Matt B. Hassell Done, MD, [facility] [phone] beeper [phone]  04/23/2016 12:09 PM

## 2016-04-23 NOTE — Progress Notes (Signed)
PHARMACY - ADULT TOTAL PARENTERAL NUTRITION CONSULT NOTE   Pharmacy Consult for TPN Indication: bowel obstruction  Patient Measurements: Height: 5\' 11"  (180.3 cm) Weight: 215 lb (97.5 kg) IBW/kg (Calculated) : 75.3   Body mass index is 29.99 kg/m. Usual Weight:  Adjusted weight 84 kg  Insulin Requirements/ 24 hrs: 4 units sensitive SSI since new TPN rate of 83 ml/hr started yesterday at 6 PM  Current Nutrition: adv to full liquids on 12/1  IVF: LR @ KVO  Central access: PICC line ordered 11/30 TPN start date: 11/30  ASSESSMENT                                                                                                          HPI: [id number] yoM presented to ED on 11/23 with abdominal pain and CT showing appendicitis with focal perforation.  Surgery consulted and found to have gangrenous appendicitis with perforation, focal peritonitis, and Meckel's Diverticulum.  S/p Lap-assisted ileocolonic resection, Omentopexy of ileocolonic anastomosis, Meckel's diverticulectomyon 11/23.  Pharmacy is now consulted to dose TPN for suspected bowel obstruction.  Significant events:  12/1 Dr. Johna SheriffHoxworth reports that patient is tolerating small amounts of liquids, but will likely need TPN nutrition support for several days. NGT d/ced. Adv to full liquids 12/2: limited oral intake  Today:   Glucose (goal <150): A1c = 6.1. 169-183  Electrolytes:  Na, Mag, CorrCa 9.3, phos are WNL.  K 3.5  Renal:  SCr improving with CrCl > 100 ml/min  LFTs: WNL (12/1)  TGs: 169 (12/1)  Prealbumin: 12.1 (12/1)  NUTRITIONAL GOALS                                                                                             RD recs (11/30): 2000-2200 kcal/day, 108-127 g protein/day, > 2L fluid/day  Clinimix 5/15 at a goal rate of 83 ml/hr + 20% fat emulsion at 10 ml/hr to provide: 100 g/day protein, 1894 Kcal/day. This meets 93% of minimum protein and 95% of minimum kCal needs  Clinimix E 5/20 2L and E 5/15 1L  are currently unavailable d/t Sport and exercise psychologistnational shortage. Clinimix E 5/20 will be used to begin therapy (1L bag), then transition to Clinimix E 5/15 (2L bags) when needed.    PLAN  At 1800 today:  contiune Clinimix E 5/15 at 83 ml/hr.  Continue 20% fat emulsion at 8410ml/hr.  Plan to advance as tolerated to the goal rate.  TPN to contain standard multivitamins and trace elements.  D/c IVF   Continue CBGs and sensitive SSI q8h  TPN lab panels on Mondays & Thursdays.  F/u daily, monitor for s/s refeeding syndrome.  Recheck magnesium and phosphorus 12/2, recheck BMET 12/2 and 12/3.   Dorna LeitzAnh Kaizen Ibsen, PharmD, BCPS 04/23/2016 9:22 AM

## 2016-04-23 NOTE — Progress Notes (Deleted)
Patient ID: Aaron Trujillo, male   DOB: Jan 23, 1971, 45 y.o.   MRN: [id number] [facility] Surgery Progress Note:   9 Days Post-Op  Subjective: Mental status is clear.  Asked by nurse to see for asymmetric swelling of penis.   Objective: Vital signs in last 24 hours: Temp:  [98.4 F (36.9 C)-99.4 F (37.4 C)] 98.5 F (36.9 C) (12/02 0558) Pulse Rate:  [85-111] 111 (12/02 0558) Resp:  [14-16] 16 (12/02 0558) BP: (133-147)/(91-100) 135/100 (12/02 0558) SpO2:  [96 %] 96 % (12/02 0558)  Intake/Output from previous day: 12/01 0701 - 12/02 0700 In: 2494.3 [P.O.:600; I.V.:1639.3; IV Piggyback:255] Out: 1250 [Urine:600; Emesis/NG output:650] Intake/Output this shift: No intake/output data recorded.  Physical Exam: Work of breathing is normal.  Not passing flatus but has continued clear.  JP drainage is serosanguinous.  The right side of his penis shaft is edematous.  I think that this is fluid that is following his JP into the subcut and is preferentially swelling the right side of his scrotum and penis.    Lab Results:  Results for orders placed or performed during the hospital encounter of 04/14/16 (from the past 48 hour(s))  Glucose, capillary     Status: Abnormal   Collection Time: 04/21/16 11:52 PM  Result Value Ref Range   Glucose-Capillary 191 (H) 65 - 99 mg/dL   Comment 1 QC Due   Comprehensive metabolic panel     Status: Abnormal   Collection Time: 04/22/16  4:24 AM  Result Value Ref Range   Sodium 136 135 - 145 mmol/L   Potassium 3.5 3.5 - 5.1 mmol/L   Chloride 103 101 - 111 mmol/L   CO2 25 22 - 32 mmol/L   Glucose, Bld 160 (H) 65 - 99 mg/dL   BUN 16 6 - 20 mg/dL   Creatinine, Ser 0.86 0.61 - 1.24 mg/dL   Calcium 8.2 (L) 8.9 - 10.3 mg/dL   Total Protein 6.7 6.5 - 8.1 g/dL   Albumin 2.6 (L) 3.5 - 5.0 g/dL   AST 26 15 - 41 U/L   ALT 29 17 - 63 U/L   Alkaline Phosphatase 69 38 - 126 U/L   Total Bilirubin 1.0 0.3 - 1.2 mg/dL   GFR calc non Af Amer >60 >60 mL/min    GFR calc Af Amer >60 >60 mL/min    Comment: (NOTE) The eGFR has been calculated using the CKD EPI equation. This calculation has not been validated in all clinical situations. eGFR's persistently <60 mL/min signify possible Chronic Kidney Disease.    Anion gap 8 5 - 15  Prealbumin     Status: Abnormal   Collection Time: 04/22/16  4:24 AM  Result Value Ref Range   Prealbumin 12.1 (L) 18 - 38 mg/dL    Comment: Performed at [facility]  Magnesium     Status: None   Collection Time: 04/22/16  4:24 AM  Result Value Ref Range   Magnesium 2.1 1.7 - 2.4 mg/dL  Phosphorus     Status: Abnormal   Collection Time: 04/22/16  4:24 AM  Result Value Ref Range   Phosphorus 2.3 (L) 2.5 - 4.6 mg/dL  Triglycerides     Status: Abnormal   Collection Time: 04/22/16  4:24 AM  Result Value Ref Range   Triglycerides 169 (H) <150 mg/dL    Comment: Performed at [facility]  CBC     Status: Abnormal   Collection Time: 04/22/16  4:24 AM  Result Value Ref  Range   WBC 13.0 (H) 4.0 - 10.5 K/uL   RBC 3.85 (L) 4.22 - 5.81 MIL/uL   Hemoglobin 12.0 (L) 13.0 - 17.0 g/dL   HCT 36.1 (L) 39.0 - 52.0 %   MCV 93.8 78.0 - 100.0 fL   MCH 31.2 26.0 - 34.0 pg   MCHC 33.2 30.0 - 36.0 g/dL   RDW 13.7 11.5 - 15.5 %   Platelets 619 (H) 150 - 400 K/uL  Differential     Status: Abnormal   Collection Time: 04/22/16  4:24 AM  Result Value Ref Range   Neutrophils Relative % 74 %   Neutro Abs 9.6 (H) 1.7 - 7.7 K/uL   Lymphocytes Relative 17 %   Lymphs Abs 2.3 0.7 - 4.0 K/uL   Monocytes Relative 8 %   Monocytes Absolute 1.1 (H) 0.1 - 1.0 K/uL   Eosinophils Relative 1 %   Eosinophils Absolute 0.1 0.0 - 0.7 K/uL   Basophils Relative 0 %   Basophils Absolute 0.0 0.0 - 0.1 K/uL  Glucose, capillary     Status: Abnormal   Collection Time: 04/22/16  7:35 AM  Result Value Ref Range   Glucose-Capillary 156 (H) 65 - 99 mg/dL  Glucose, capillary     Status: Abnormal   Collection Time: 04/22/16  4:44 PM  Result  Value Ref Range   Glucose-Capillary 169 (H) 65 - 99 mg/dL  Glucose, capillary     Status: Abnormal   Collection Time: 04/22/16 11:50 PM  Result Value Ref Range   Glucose-Capillary 188 (H) 65 - 99 mg/dL  Phosphorus     Status: None   Collection Time: 04/23/16  3:47 AM  Result Value Ref Range   Phosphorus 2.9 2.5 - 4.6 mg/dL  Basic metabolic panel     Status: Abnormal   Collection Time: 04/23/16  3:47 AM  Result Value Ref Range   Sodium 136 135 - 145 mmol/L   Potassium 3.5 3.5 - 5.1 mmol/L   Chloride 100 (L) 101 - 111 mmol/L   CO2 26 22 - 32 mmol/L   Glucose, Bld 169 (H) 65 - 99 mg/dL   BUN 16 6 - 20 mg/dL   Creatinine, Ser 0.78 0.61 - 1.24 mg/dL   Calcium 8.6 (L) 8.9 - 10.3 mg/dL   GFR calc non Af Amer >60 >60 mL/min   GFR calc Af Amer >60 >60 mL/min    Comment: (NOTE) The eGFR has been calculated using the CKD EPI equation. This calculation has not been validated in all clinical situations. eGFR's persistently <60 mL/min signify possible Chronic Kidney Disease.    Anion gap 10 5 - 15  Magnesium     Status: None   Collection Time: 04/23/16  3:47 AM  Result Value Ref Range   Magnesium 2.3 1.7 - 2.4 mg/dL  Glucose, capillary     Status: Abnormal   Collection Time: 04/23/16  8:31 AM  Result Value Ref Range   Glucose-Capillary 183 (H) 65 - 99 mg/dL    Radiology/Results: Dg Abd 1 View  Result Date: 04/22/2016 CLINICAL DATA:  Small bowel obstruction. EXAM: ABDOMEN - 1 VIEW COMPARISON:  Radiograph of April 21, 2016. FINDINGS: Nasogastric tube tip is seen in expected position of distal stomach. Mildly dilated small bowel loops are seen in left side of the abdomen which are slightly improved compared to prior exam, and may represent improving ileus or possibly obstruction. No calcifications are noted. IMPRESSION: Mildly dilated small bowel loops are noted in left side of  abdomen which are slightly improved compared to prior exam, suggesting improving ileus or possibly improving  distal small bowel obstruction. Electronically Signed   By: Marijo Conception, M.D.   On: 04/22/2016 07:52    Anti-infectives: Anti-infectives    Start     Dose/Rate Route Frequency Ordered Stop   04/14/16 1730  clindamycin (CLEOCIN) 900 mg, gentamicin (GARAMYCIN) 240 mg in sodium chloride 0.9 % 1,000 mL for intraperitoneal lavage  Status:  Discontinued      Intraperitoneal To Surgery 04/14/16 1719 04/14/16 2103   04/14/16 1545  piperacillin-tazobactam (ZOSYN) IVPB 3.375 g     3.375 g 12.5 mL/hr over 240 Minutes Intravenous Every 8 hours 04/14/16 1533 04/19/16 1159   04/14/16 1530  piperacillin-tazobactam (ZOSYN) IVPB 3.375 g     3.375 g 12.5 mL/hr over 240 Minutes Intravenous  Once 04/14/16 1529 04/14/16 1946   04/14/16 1515  Ampicillin-Sulbactam (UNASYN) 3 g in sodium chloride 0.9 % 100 mL IVPB  Status:  Discontinued     3 g 200 mL/hr over 30 Minutes Intravenous  Once 04/14/16 1514 04/14/16 1529   04/14/16 1515  Ampicillin-Sulbactam (UNASYN) 3 g in sodium chloride 0.9 % 100 mL IVPB  Status:  Discontinued     3 g 200 mL/hr over 30 Minutes Intravenous  Once 04/14/16 1514 04/14/16 1529      Assessment/Plan: Problem List: Patient Active Problem List   Diagnosis Date Noted  . Pre-diabetes 04/16/2016  . Acute gangrenous appendicitis with perforation and peritonitis s/p ileocolectomy 04/14/2016 04/14/2016  . Hyperglycemia 04/14/2016  . Meckels diverticulum s/p diverticulectomy 04/14/2016 04/14/2016    Ileus-post small bowel and cecum resection with Meckel's resection and followup laparoscopy with drain placement.  Will make NPO for now and continue TNA 9 Days Post-Op    LOS: 9 days   Matt B. Hassell Done, MD, [facility] [phone] beeper [phone]  04/23/2016 10:05 AM

## 2016-04-24 ENCOUNTER — Inpatient Hospital Stay (HOSPITAL_COMMUNITY): Payer: Medicaid Other

## 2016-04-24 ENCOUNTER — Encounter (HOSPITAL_COMMUNITY): Payer: Self-pay | Admitting: *Deleted

## 2016-04-24 LAB — BASIC METABOLIC PANEL
Anion gap: 7 (ref 5–15)
BUN: 20 mg/dL (ref 6–20)
CALCIUM: 8.2 mg/dL — AB (ref 8.9–10.3)
CO2: 25 mmol/L (ref 22–32)
CREATININE: 0.81 mg/dL (ref 0.61–1.24)
Chloride: 102 mmol/L (ref 101–111)
Glucose, Bld: 150 mg/dL — ABNORMAL HIGH (ref 65–99)
Potassium: 3.8 mmol/L (ref 3.5–5.1)
Sodium: 134 mmol/L — ABNORMAL LOW (ref 135–145)

## 2016-04-24 LAB — CBC WITH DIFFERENTIAL/PLATELET
BASOS PCT: 0 %
Basophils Absolute: 0 10*3/uL (ref 0.0–0.1)
EOS ABS: 0.2 10*3/uL (ref 0.0–0.7)
Eosinophils Relative: 1 %
HCT: 36.7 % — ABNORMAL LOW (ref 39.0–52.0)
Hemoglobin: 12.4 g/dL — ABNORMAL LOW (ref 13.0–17.0)
Lymphocytes Relative: 15 %
Lymphs Abs: 3.1 10*3/uL (ref 0.7–4.0)
MCH: 31.3 pg (ref 26.0–34.0)
MCHC: 33.8 g/dL (ref 30.0–36.0)
MCV: 92.7 fL (ref 78.0–100.0)
MONO ABS: 1 10*3/uL (ref 0.1–1.0)
MONOS PCT: 5 %
NEUTROS PCT: 79 %
Neutro Abs: 15.7 10*3/uL — ABNORMAL HIGH (ref 1.7–7.7)
Platelets: 736 10*3/uL — ABNORMAL HIGH (ref 150–400)
RBC: 3.96 MIL/uL — ABNORMAL LOW (ref 4.22–5.81)
RDW: 13.3 % (ref 11.5–15.5)
WBC: 20 10*3/uL — ABNORMAL HIGH (ref 4.0–10.5)

## 2016-04-24 LAB — GLUCOSE, CAPILLARY
GLUCOSE-CAPILLARY: 151 mg/dL — AB (ref 65–99)
Glucose-Capillary: 127 mg/dL — ABNORMAL HIGH (ref 65–99)
Glucose-Capillary: 131 mg/dL — ABNORMAL HIGH (ref 65–99)

## 2016-04-24 MED ORDER — TRACE MINERALS CR-CU-MN-SE-ZN 10-1000-500-60 MCG/ML IV SOLN
INTRAVENOUS | Status: AC
  Administered 2016-04-24: 18:00:00 via INTRAVENOUS
  Filled 2016-04-24: qty 1992

## 2016-04-24 MED ORDER — FAT EMULSION 20 % IV EMUL
240.0000 mL | INTRAVENOUS | Status: AC
  Administered 2016-04-24: 240 mL via INTRAVENOUS
  Filled 2016-04-24: qty 250

## 2016-04-24 MED ORDER — IOPAMIDOL (ISOVUE-300) INJECTION 61%
INTRAVENOUS | Status: AC
  Administered 2016-04-24: 14:00:00
  Filled 2016-04-24: qty 30

## 2016-04-24 MED ORDER — IOPAMIDOL (ISOVUE-300) INJECTION 61%
100.0000 mL | Freq: Once | INTRAVENOUS | Status: AC | PRN
  Administered 2016-04-24: 100 mL via INTRAVENOUS

## 2016-04-24 MED ORDER — PIPERACILLIN-TAZOBACTAM 3.375 G IVPB
3.3750 g | Freq: Three times a day (TID) | INTRAVENOUS | Status: DC
  Administered 2016-04-24 – 2016-04-29 (×14): 3.375 g via INTRAVENOUS
  Filled 2016-04-24 (×16): qty 50

## 2016-04-24 MED ORDER — IOPAMIDOL (ISOVUE-300) INJECTION 61%: 30.0000 mL | Freq: Once | INTRAVENOUS | Status: DC | PRN

## 2016-04-24 MED ORDER — SODIUM CHLORIDE 0.9 % IJ SOLN
INTRAMUSCULAR | Status: AC
  Filled 2016-04-24: qty 50

## 2016-04-24 MED ORDER — IOPAMIDOL (ISOVUE-300) INJECTION 61%
INTRAVENOUS | Status: AC
  Administered 2016-04-24: 30 mL
  Filled 2016-04-24: qty 100

## 2016-04-24 NOTE — Progress Notes (Signed)
Patient ID: Aaron Trujillo, male   DOB: Oct 17, 1970, 45 y.o.   MRN: [id number] [facility] Surgery Progress Note:   10 Days Post-Op  Subjective: Mental status is clear.  No new complaints Objective: Vital signs in last 24 hours: Temp:  [97.7 F (36.5 C)-98.8 F (37.1 C)] 97.7 F (36.5 C) (12/03 0542) Pulse Rate:  [82-111] 82 (12/03 0542) Resp:  [16] 16 (12/03 0542) BP: (121-127)/(77-94) 124/81 (12/03 0542) SpO2:  [94 %-96 %] 96 % (12/03 0542)  Intake/Output from previous day: 12/02 0701 - 12/03 0700 In: 2472 [I.V.:2472] Out: 850 [Urine:850] Intake/Output this shift: Total I/O In: 0  Out: 500 [Urine:500]  Physical Exam: Work of breathing is not labored at rest.  Abdomen is moderately distended and firm.    Lab Results:  Results for orders placed or performed during the hospital encounter of 04/14/16 (from the past 48 hour(s))  Glucose, capillary     Status: Abnormal   Collection Time: 04/22/16  4:44 PM  Result Value Ref Range   Glucose-Capillary 169 (H) 65 - 99 mg/dL  Glucose, capillary     Status: Abnormal   Collection Time: 04/22/16 11:50 PM  Result Value Ref Range   Glucose-Capillary 188 (H) 65 - 99 mg/dL  Phosphorus     Status: None   Collection Time: 04/23/16  3:47 AM  Result Value Ref Range   Phosphorus 2.9 2.5 - 4.6 mg/dL  Basic metabolic panel     Status: Abnormal   Collection Time: 04/23/16  3:47 AM  Result Value Ref Range   Sodium 136 135 - 145 mmol/L   Potassium 3.5 3.5 - 5.1 mmol/L   Chloride 100 (L) 101 - 111 mmol/L   CO2 26 22 - 32 mmol/L   Glucose, Bld 169 (H) 65 - 99 mg/dL   BUN 16 6 - 20 mg/dL   Creatinine, Ser 0.78 0.61 - 1.24 mg/dL   Calcium 8.6 (L) 8.9 - 10.3 mg/dL   GFR calc non Af Amer >60 >60 mL/min   GFR calc Af Amer >60 >60 mL/min    Comment: (NOTE) The eGFR has been calculated using the CKD EPI equation. This calculation has not been validated in all clinical situations. eGFR's persistently <60 mL/min signify possible Chronic  Kidney Disease.    Anion gap 10 5 - 15  Magnesium     Status: None   Collection Time: 04/23/16  3:47 AM  Result Value Ref Range   Magnesium 2.3 1.7 - 2.4 mg/dL  Glucose, capillary     Status: Abnormal   Collection Time: 04/23/16  8:31 AM  Result Value Ref Range   Glucose-Capillary 183 (H) 65 - 99 mg/dL  Glucose, capillary     Status: Abnormal   Collection Time: 04/23/16  4:39 PM  Result Value Ref Range   Glucose-Capillary 165 (H) 65 - 99 mg/dL  Glucose, capillary     Status: Abnormal   Collection Time: 04/23/16 11:27 PM  Result Value Ref Range   Glucose-Capillary 145 (H) 65 - 99 mg/dL  Basic metabolic panel     Status: Abnormal   Collection Time: 04/24/16  3:47 AM  Result Value Ref Range   Sodium 134 (L) 135 - 145 mmol/L   Potassium 3.8 3.5 - 5.1 mmol/L   Chloride 102 101 - 111 mmol/L   CO2 25 22 - 32 mmol/L   Glucose, Bld 150 (H) 65 - 99 mg/dL   BUN 20 6 - 20 mg/dL   Creatinine, Ser 0.81 0.61 -  1.24 mg/dL   Calcium 8.2 (L) 8.9 - 10.3 mg/dL   GFR calc non Af Amer >60 >60 mL/min   GFR calc Af Amer >60 >60 mL/min    Comment: (NOTE) The eGFR has been calculated using the CKD EPI equation. This calculation has not been validated in all clinical situations. eGFR's persistently <60 mL/min signify possible Chronic Kidney Disease.    Anion gap 7 5 - 15  CBC with Differential/Platelet     Status: Abnormal   Collection Time: 04/24/16  3:47 AM  Result Value Ref Range   WBC 20.0 (H) 4.0 - 10.5 K/uL   RBC 3.96 (L) 4.22 - 5.81 MIL/uL   Hemoglobin 12.4 (L) 13.0 - 17.0 g/dL   HCT 36.7 (L) 39.0 - 52.0 %   MCV 92.7 78.0 - 100.0 fL   MCH 31.3 26.0 - 34.0 pg   MCHC 33.8 30.0 - 36.0 g/dL   RDW 13.3 11.5 - 15.5 %   Platelets 736 (H) 150 - 400 K/uL   Neutrophils Relative % 79 %   Neutro Abs 15.7 (H) 1.7 - 7.7 K/uL   Lymphocytes Relative 15 %   Lymphs Abs 3.1 0.7 - 4.0 K/uL   Monocytes Relative 5 %   Monocytes Absolute 1.0 0.1 - 1.0 K/uL   Eosinophils Relative 1 %   Eosinophils  Absolute 0.2 0.0 - 0.7 K/uL   Basophils Relative 0 %   Basophils Absolute 0.0 0.0 - 0.1 K/uL  Glucose, capillary     Status: Abnormal   Collection Time: 04/24/16  8:12 AM  Result Value Ref Range   Glucose-Capillary 151 (H) 65 - 99 mg/dL    Radiology/Results: Dg Abd 2 Views  Result Date: 04/23/2016 CLINICAL DATA:  Recent ileocolonic resection with Meckel's diverticulum and appendectomy. Evaluate ileus. EXAM: ABDOMEN - 2 VIEW COMPARISON:  04/22/2016 FINDINGS: Nasogastric tube has been removed. Multiple air-fluid levels throughout the abdomen with small bowel gaseous distension. No evidence for free air. Air-fluid level in the stomach. There may be some gas in the colon splenic flexure region. Few densities at the left lung base are suggestive for atelectasis. IMPRESSION: Persistent dilated loops of small bowel throughout the abdomen with air-fluid levels. A small amount of gas in the colon. Findings could represent a postoperative ileus. However, a partial small bowel obstruction cannot be excluded. No significant change in the small bowel distension. Electronically Signed   By: Markus Daft M.D.   On: 04/23/2016 15:54    Anti-infectives: Anti-infectives    Start     Dose/Rate Route Frequency Ordered Stop   04/14/16 1730  clindamycin (CLEOCIN) 900 mg, gentamicin (GARAMYCIN) 240 mg in sodium chloride 0.9 % 1,000 mL for intraperitoneal lavage  Status:  Discontinued      Intraperitoneal To Surgery 04/14/16 1719 04/14/16 2103   04/14/16 1545  piperacillin-tazobactam (ZOSYN) IVPB 3.375 g     3.375 g 12.5 mL/hr over 240 Minutes Intravenous Every 8 hours 04/14/16 1533 04/19/16 1159   04/14/16 1530  piperacillin-tazobactam (ZOSYN) IVPB 3.375 g     3.375 g 12.5 mL/hr over 240 Minutes Intravenous  Once 04/14/16 1529 04/14/16 1946   04/14/16 1515  Ampicillin-Sulbactam (UNASYN) 3 g in sodium chloride 0.9 % 100 mL IVPB  Status:  Discontinued     3 g 200 mL/hr over 30 Minutes Intravenous  Once 04/14/16  1514 04/14/16 1529   04/14/16 1515  Ampicillin-Sulbactam (UNASYN) 3 g in sodium chloride 0.9 % 100 mL IVPB  Status:  Discontinued  3 g 200 mL/hr over 30 Minutes Intravenous  Once 04/14/16 1514 04/14/16 1529      Assessment/Plan: Problem List: Patient Active Problem List   Diagnosis Date Noted  . Pre-diabetes 04/16/2016  . Acute gangrenous appendicitis with perforation and peritonitis s/p ileocolectomy 04/14/2016 04/14/2016  . Hyperglycemia 04/14/2016  . Meckels diverticulum s/p diverticulectomy 04/14/2016 04/14/2016    Xray yesterday showed some gas in left colon and he has had some flatus.  However today's WBC is 20k--will get CT to look for abscess.   10 Days Post-Op    LOS: 10 days   Matt B. Hassell Done, MD, [facility] [phone] beeper [phone]  04/24/2016 11:16 AM

## 2016-04-24 NOTE — Progress Notes (Signed)
PHARMACY - ADULT TOTAL PARENTERAL NUTRITION CONSULT NOTE   Pharmacy Consult for TPN Indication: bowel obstruction  Patient Measurements: Height: 5\' 11"  (180.3 cm) Weight: 215 lb (97.5 kg) IBW/kg (Calculated) : 75.3   Body mass index is 29.99 kg/m. Usual Weight:  Adjusted weight 84 kg  Insulin Requirements/ 24 hrs: 5 units (on sensitive SSI)  Current Nutrition: adv to full liquids on 12/1  IVF: none  Central access: PICC line ordered 11/30 TPN start date: 11/30  ASSESSMENT                                                                                                          HPI: [id number] yoM presented to ED on 11/23 with abdominal pain and CT showing appendicitis with focal perforation.  Surgery consulted and found to have gangrenous appendicitis with perforation, focal peritonitis, and Meckel's Diverticulum.  S/p Lap-assisted ileocolonic resection, Omentopexy of ileocolonic anastomosis, Meckel's diverticulectomyon 11/23.  Pharmacy is now consulted to dose TPN for suspected bowel obstruction.  Significant events:  12/1 Dr. Johna SheriffHoxworth reports that patient is tolerating small amounts of liquids, but will likely need TPN nutrition support for several days. NGT d/ced. Adv to full liquids 12/2: limited oral intake  Today:   Glucose (goal <150): A1c = 6.1. 145-183  Electrolytes:  Na slightly low at 134; Mag(on 12/2), CorrCa, phos (on 12/2), K+ are WNL.   Renal:  SCr wnl (CrCl > 100 ml/min)  LFTs: WNL (12/1)  TGs: 169 (12/1)  Prealbumin: 12.1 (12/1)  NUTRITIONAL GOALS                                                                                             RD recs (11/30): 2000-2200 kcal/day, 108-127 g protein/day, > 2L fluid/day  Clinimix 5/15 at a goal rate of 83 ml/hr + 20% fat emulsion at 10 ml/hr to provide: 100 g/day protein, 1894 Kcal/day. This meets 93% of minimum protein and 95% of minimum kCal needs  Clinimix E 5/20 2L and E 5/15 1L are currently unavailable d/t  Sport and exercise psychologistnational shortage. Clinimix E 5/20 will be used to begin therapy (1L bag), then transition to Clinimix E 5/15 (2L bags) when needed.    PLAN  At 1800 today:  contiune Clinimix E 5/15 at 83 ml/hr.  Continue 20% fat emulsion at 210ml/hr.  Plan to advance as tolerated to the goal rate.  TPN to contain standard multivitamins and trace elements.  Continue CBGs and sensitive SSI q8h  TPN lab panels on Mondays & Thursdays.  F/u daily, monitor for s/s refeeding syndrome.   Dorna LeitzAnh Abou Sterkel, PharmD, BCPS 04/24/2016 8:01 AM

## 2016-04-24 NOTE — Progress Notes (Signed)
General Surgery [facility]  CT scan today shows 2.5 cm abscess right retroperitoneum.  WBC elevated at 20K.  Will restart Zosyn IV.  CT scan to be reviewed by IR on Monday.  tmg  Velora Hecklerodd M. Letricia Krinsky, MD, [facility] Office: [phone]

## 2016-04-25 LAB — COMPREHENSIVE METABOLIC PANEL
ALBUMIN: 3.4 g/dL — AB (ref 3.5–5.0)
ALK PHOS: 88 U/L (ref 38–126)
ALT: 29 U/L (ref 17–63)
ANION GAP: 7 (ref 5–15)
AST: 25 U/L (ref 15–41)
BUN: 21 mg/dL — ABNORMAL HIGH (ref 6–20)
CALCIUM: 8.5 mg/dL — AB (ref 8.9–10.3)
CHLORIDE: 98 mmol/L — AB (ref 101–111)
CO2: 25 mmol/L (ref 22–32)
CREATININE: 0.87 mg/dL (ref 0.61–1.24)
GFR calc Af Amer: >60 mL/min (ref >60)
GFR calc non Af Amer: >60 mL/min (ref >60)
GLUCOSE: 141 mg/dL — AB (ref 65–99)
Potassium: 4.2 mmol/L (ref 3.5–5.1)
SODIUM: 130 mmol/L — AB (ref 135–145)
Total Bilirubin: 0.9 mg/dL (ref 0.3–1.2)
Total Protein: 7.7 g/dL (ref 6.5–8.1)

## 2016-04-25 LAB — DIFFERENTIAL
BASOS PCT: 0 %
Basophils Absolute: 0 10*3/uL (ref 0.0–0.1)
Eosinophils Absolute: 0.2 10*3/uL (ref 0.0–0.7)
Eosinophils Relative: 1 %
LYMPHS ABS: 2.8 10*3/uL (ref 0.7–4.0)
Lymphocytes Relative: 12 %
MONO ABS: 1.1 10*3/uL — AB (ref 0.1–1.0)
MONOS PCT: 5 %
NEUTROS ABS: 18.5 10*3/uL — AB (ref 1.7–7.7)
Neutrophils Relative %: 82 %

## 2016-04-25 LAB — PREALBUMIN: Prealbumin: 20.7 mg/dL (ref 18–38)

## 2016-04-25 LAB — CBC
HCT: 39.6 % (ref 39.0–52.0)
Hemoglobin: 13 g/dL (ref 13.0–17.0)
MCH: 31.3 pg (ref 26.0–34.0)
MCHC: 32.8 g/dL (ref 30.0–36.0)
MCV: 95.2 fL (ref 78.0–100.0)
Platelets: 778 K/uL — ABNORMAL HIGH (ref 150–400)
RBC: 4.16 MIL/uL — ABNORMAL LOW (ref 4.22–5.81)
RDW: 13.5 % (ref 11.5–15.5)
WBC: 22.6 K/uL — ABNORMAL HIGH (ref 4.0–10.5)

## 2016-04-25 LAB — GLUCOSE, CAPILLARY
GLUCOSE-CAPILLARY: 149 mg/dL — AB (ref 65–99)
GLUCOSE-CAPILLARY: 153 mg/dL — AB (ref 65–99)
GLUCOSE-CAPILLARY: 171 mg/dL — AB (ref 65–99)
Glucose-Capillary: 138 mg/dL — ABNORMAL HIGH (ref 65–99)

## 2016-04-25 LAB — TRIGLYCERIDES: Triglycerides: 177 mg/dL — ABNORMAL HIGH (ref <150)

## 2016-04-25 LAB — MAGNESIUM: Magnesium: 2 mg/dL (ref 1.7–2.4)

## 2016-04-25 LAB — PHOSPHORUS: Phosphorus: 3.4 mg/dL (ref 2.5–4.6)

## 2016-04-25 MED ORDER — TRACE MINERALS CR-CU-MN-SE-ZN 10-1000-500-60 MCG/ML IV SOLN
INTRAVENOUS | Status: AC
  Administered 2016-04-25: 18:00:00 via INTRAVENOUS
  Filled 2016-04-25: qty 1992

## 2016-04-25 MED ORDER — FAT EMULSION 20 % IV EMUL
240.0000 mL | INTRAVENOUS | Status: AC
  Administered 2016-04-25: 240 mL via INTRAVENOUS
  Filled 2016-04-25: qty 250

## 2016-04-25 NOTE — Progress Notes (Signed)
Pt complained earlier this am that he was having sharp, right flank pain.  I told him it could be positional, and advised him to sit in chair to see it improved.  He said sitting in the chair helped his pain.  Approx 2 hrs later, IV nurse asked me if the pt could have something for pain.  Pt was in bed again, and I asked him where his pain was, and he reported it to be in the same flank area.  I administered 1000 mg robaxin po.  Will continue to monitor.

## 2016-04-25 NOTE — Progress Notes (Signed)
PHARMACY - ADULT TOTAL PARENTERAL NUTRITION CONSULT NOTE   Pharmacy Consult for TPN Indication: bowel obstruction  Patient Measurements: Height: 5\' 11"  (180.3 cm) Weight: 215 lb (97.5 kg) IBW/kg (Calculated) : 75.3   Body mass index is 29.99 kg/m. Usual Weight:  Adjusted weight 84 kg  Insulin Requirements/ 24 hrs: 4 units (on sensitive SSI)  Current Nutrition: adv to full liquids on 12/1  IVF: LR 10 ml/hr  Central access: PICC line ordered 11/30 TPN start date: 11/30  ASSESSMENT                                                                                                          HPI: Aaron Trujillo presented to ED on 11/23 with abdominal pain and CT showing appendicitis with focal perforation.  Surgery consulted and found to have gangrenous appendicitis with perforation, focal peritonitis, and Meckel's Diverticulum.  S/p Lap-assisted ileocolonic resection, Omentopexy of ileocolonic anastomosis, Meckel's diverticulectomyon 11/23.  Pharmacy is now consulted to dose TPN for suspected bowel obstruction.  Significant events:  12/1 Dr. Johna SheriffHoxworth reports that patient is tolerating small amounts of liquids, but will likely need TPN nutrition support for several days. NGT d/ced. Adv to full liquids 12/2: limited oral intake 12/3: CT shows 2.5 cm abscess right retroperitoneum, WBC elevated at 20k, Zosyn restarted.  Today:   Glucose: one value > goal of <150; A1c = 6.1  Electrolytes:  Na slightly low at 130; all other lytes are WNL.   Renal:  SCr wnl (CrCl > 100 ml/min)  LFTs: WNL  TGs: 169 (12/1), pending today  Prealbumin: 12.1 (12/1), pending today  NUTRITIONAL GOALS                                                                                             RD recs (11/30): 2000-2200 kcal/day, 108-127 g protein/day, > 2L fluid/day  Clinimix 5/15 at a goal rate of 83 ml/hr + 20% fat emulsion at 10 ml/hr to provide: 100 g/day protein, 1894 Kcal/day. This meets 93% of minimum  protein and 95% of minimum kCal needs  Clinimix E 5/20 2L and E 5/15 1L are currently unavailable d/t Sport and exercise psychologistnational shortage. Clinimix E 5/20 will be used to begin therapy (1L bag), then transition to Clinimix E 5/15 (2L bags) when needed.    PLAN  At 1800 today:  contiune Clinimix E 5/15 at 83 ml/hr.  Continue 20% fat emulsion at 3010ml/hr.  TPN to contain standard multivitamins and trace elements.  Continue CBGs and sensitive SSI q8h  TPN lab panels on Mondays & Thursdays.  F/u daily, monitor for s/s refeeding syndrome.   Loralee PacasErin Jakera Beaupre, PharmD, BCPS Pager: [phone] 04/25/2016 7:40 AM

## 2016-04-25 NOTE — Progress Notes (Signed)
Nutrition Follow-up  INTERVENTION:   TPN per Pharmacy RD to continue to monitor for plan  NUTRITION DIAGNOSIS:   Inadequate oral intake related to altered GI function as evidenced by meal completion < 25%, per patient/family report.  Ongoing.  GOAL:   Patient will meet greater than or equal to 90% of their needs  Meeting with TPN.  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 Now with SBO and abd distension that worsens after eating  Patient continues with TPN: Clinimix E 5/15 @ 83 ml/hr and ILE @ 10, providing 1894 kcal (95% of needs). and 100g protein (100% of needs).  Unable to advance diet past full liquids since admission on 11/23 (10 days). No NGT. CT scan from 12/3 showed abscess in rt retroperitoneum.  Medications: Lasix tablet BID, IV Zofran PRN Labs reviewed: CBGs: 131-171 Low Na Mg/Phos/K WNL TG: 177 mg/dL  Plan per Pharmacy 46/912/4: At 1800 today:  contiune Clinimix E 5/15 at 83 ml/hr.  Continue 20% fat emulsion at 5210ml/hr.  Diet Order:  Diet - low sodium heart healthy .TPN (CLINIMIX-E) Adult .TPN (CLINIMIX-E) Adult Diet NPO time specified  Skin:  Wound (see comment) (abd incision )  Last BM:  12/3  Height:   Ht Readings from Last 1 Encounters:  04/14/16 5\' 11"  (1.803 m)    Weight:   Wt Readings from Last 1 Encounters:  04/14/16 215 lb (97.5 kg)    Ideal Body Weight:  78.2 kg  BMI:  Body mass index is 29.99 kg/m.  Estimated Nutritional Needs:   Kcal:  2000-2200kcal/day   Protein:  95-105g  Fluid:  >2L/day   EDUCATION NEEDS:   No education needs identified at this time  Aaron FrancoLindsey Aaron Leaks, MS, RD, LDN Pager: [phone] After Hours Pager: [phone]

## 2016-04-25 NOTE — Progress Notes (Signed)
Patient ID: Aaron Trujillo, male   DOB: [birth year], 45 y.o.   MRN: [id number] [facility] Progress Note:   11 Days Post-Op  Subjective: Mental status is clear.  Had right sided back pain last night Objective: Vital signs in last 24 hours: Temp:  [97.5 F (36.4 C)-99.1 F (37.3 C)] 99.1 F (37.3 C) (12/04 0541) Pulse Rate:  [84-97] 92 (12/04 0541) Resp:  [16-18] 18 (12/04 0541) BP: (122-128)/(81-85) 122/85 (12/04 0541) SpO2:  [96 %-99 %] 96 % (12/04 0541)  Intake/Output from previous day: 12/03 0701 - 12/04 0700 In: 2810.6 [P.O.:240; I.V.:2470.6; IV Piggyback:100] Out: 2910 [Urine:2910] Intake/Output this shift: No intake/output data recorded.  Physical Exam: Work of breathing is not labored.  2 cm collection near duodenum may be source of pain.    Lab Results:  Results for orders placed or performed during the hospital encounter of 04/14/16 (from the past 48 hour(s))  Glucose, capillary     Status: Abnormal   Collection Time: 04/23/16  4:39 PM  Result Value Ref Range   Glucose-Capillary 165 (H) 65 - 99 mg/dL  Glucose, capillary     Status: Abnormal   Collection Time: 04/23/16 11:27 PM  Result Value Ref Range   Glucose-Capillary 145 (H) 65 - 99 mg/dL  Basic metabolic panel     Status: Abnormal   Collection Time: 04/24/16  3:47 AM  Result Value Ref Range   Sodium 134 (L) 135 - 145 mmol/L   Potassium 3.8 3.5 - 5.1 mmol/L   Chloride 102 101 - 111 mmol/L   CO2 25 22 - 32 mmol/L   Glucose, Bld 150 (H) 65 - 99 mg/dL   BUN 20 6 - 20 mg/dL   Creatinine, Ser 0.81 0.61 - 1.24 mg/dL   Calcium 8.2 (L) 8.9 - 10.3 mg/dL   GFR calc non Af Amer >60 >60 mL/min   GFR calc Af Amer >60 >60 mL/min    Comment: (NOTE) The eGFR has been calculated using the CKD EPI equation. This calculation has not been validated in all clinical situations. eGFR's persistently <60 mL/min signify possible Chronic Kidney Disease.    Anion gap 7 5 - 15  CBC with Differential/Platelet      Status: Abnormal   Collection Time: 04/24/16  3:47 AM  Result Value Ref Range   WBC 20.0 (H) 4.0 - 10.5 K/uL   RBC 3.96 (L) 4.22 - 5.81 MIL/uL   Hemoglobin 12.4 (L) 13.0 - 17.0 g/dL   HCT 36.7 (L) 39.0 - 52.0 %   MCV 92.7 78.0 - 100.0 fL   MCH 31.3 26.0 - 34.0 pg   MCHC 33.8 30.0 - 36.0 g/dL   RDW 13.3 11.5 - 15.5 %   Platelets 736 (H) 150 - 400 K/uL   Neutrophils Relative % 79 %   Neutro Abs 15.7 (H) 1.7 - 7.7 K/uL   Lymphocytes Relative 15 %   Lymphs Abs 3.1 0.7 - 4.0 K/uL   Monocytes Relative 5 %   Monocytes Absolute 1.0 0.1 - 1.0 K/uL   Eosinophils Relative 1 %   Eosinophils Absolute 0.2 0.0 - 0.7 K/uL   Basophils Relative 0 %   Basophils Absolute 0.0 0.0 - 0.1 K/uL  Glucose, capillary     Status: Abnormal   Collection Time: 04/24/16  8:12 AM  Result Value Ref Range   Glucose-Capillary 151 (H) 65 - 99 mg/dL  Glucose, capillary     Status: Abnormal   Collection Time: 04/24/16  5:23 PM  Result Value Ref Range   Glucose-Capillary 127 (H) 65 - 99 mg/dL   Comment 1 Notify RN    Comment 2 Document in Chart   Glucose, capillary     Status: Abnormal   Collection Time: 04/24/16 11:32 PM  Result Value Ref Range   Glucose-Capillary 131 (H) 65 - 99 mg/dL  Comprehensive metabolic panel     Status: Abnormal   Collection Time: 04/25/16  4:11 AM  Result Value Ref Range   Sodium 130 (L) 135 - 145 mmol/L   Potassium 4.2 3.5 - 5.1 mmol/L   Chloride 98 (L) 101 - 111 mmol/L   CO2 25 22 - 32 mmol/L   Glucose, Bld 141 (H) 65 - 99 mg/dL   BUN 21 (H) 6 - 20 mg/dL   Creatinine, Ser 0.87 0.61 - 1.24 mg/dL   Calcium 8.5 (L) 8.9 - 10.3 mg/dL   Total Protein 7.7 6.5 - 8.1 g/dL   Albumin 3.4 (L) 3.5 - 5.0 g/dL   AST 25 15 - 41 U/L   ALT 29 17 - 63 U/L   Alkaline Phosphatase 88 38 - 126 U/L   Total Bilirubin 0.9 0.3 - 1.2 mg/dL   GFR calc non Af Amer >60 >60 mL/min   GFR calc Af Amer >60 >60 mL/min    Comment: (NOTE) The eGFR has been calculated using the CKD EPI equation. This  calculation has not been validated in all clinical situations. eGFR's persistently <60 mL/min signify possible Chronic Kidney Disease.    Anion gap 7 5 - 15  Magnesium     Status: None   Collection Time: 04/25/16  4:11 AM  Result Value Ref Range   Magnesium 2.0 1.7 - 2.4 mg/dL  Phosphorus     Status: None   Collection Time: 04/25/16  4:11 AM  Result Value Ref Range   Phosphorus 3.4 2.5 - 4.6 mg/dL  CBC     Status: Abnormal   Collection Time: 04/25/16  4:11 AM  Result Value Ref Range   WBC 22.6 (H) 4.0 - 10.5 K/uL   RBC 4.16 (L) 4.22 - 5.81 MIL/uL   Hemoglobin 13.0 13.0 - 17.0 g/dL   HCT 39.6 39.0 - 52.0 %   MCV 95.2 78.0 - 100.0 fL   MCH 31.3 26.0 - 34.0 pg   MCHC 32.8 30.0 - 36.0 g/dL   RDW 13.5 11.5 - 15.5 %   Platelets 778 (H) 150 - 400 K/uL  Differential     Status: Abnormal   Collection Time: 04/25/16  4:11 AM  Result Value Ref Range   Neutrophils Relative % 82 %   Neutro Abs 18.5 (H) 1.7 - 7.7 K/uL   Lymphocytes Relative 12 %   Lymphs Abs 2.8 0.7 - 4.0 K/uL   Monocytes Relative 5 %   Monocytes Absolute 1.1 (H) 0.1 - 1.0 K/uL   Eosinophils Relative 1 %   Eosinophils Absolute 0.2 0.0 - 0.7 K/uL   Basophils Relative 0 %   Basophils Absolute 0.0 0.0 - 0.1 K/uL  Triglycerides     Status: Abnormal   Collection Time: 04/25/16  4:11 AM  Result Value Ref Range   Triglycerides 177 (H) <150 mg/dL    Comment: Performed at [facility]  Prealbumin     Status: None   Collection Time: 04/25/16  4:11 AM  Result Value Ref Range   Prealbumin 20.7 18 - 38 mg/dL    Comment: Performed at [facility]  Glucose, capillary  Status: Abnormal   Collection Time: 04/25/16  7:32 AM  Result Value Ref Range   Glucose-Capillary 171 (H) 65 - 99 mg/dL    Radiology/Results: Ct Abdomen Pelvis W Contrast  Result Date: 04/24/2016 CLINICAL DATA:  Ten days postop from ileocolonic resection for Meckel's diverticulum and appendectomy. Dilated bowel loops on abdominal  radiographs. Leukocytosis. EXAM: CT ABDOMEN AND PELVIS WITH CONTRAST TECHNIQUE: Multidetector CT imaging of the abdomen and pelvis was performed using the standard protocol following bolus administration of intravenous contrast. CONTRAST:  158m ISOVUE-300 IOPAMIDOL (ISOVUE-300) INJECTION 61% COMPARISON:  CT on 04/14/2016 FINDINGS: Lower Chest: Tiny bilateral pleural effusions and dependent bibasilar atelectasis. Hepatobiliary: No masses identified. Tiny less than 5 mm calcified gallstone again noted, however there is no evidence of acute cholecystitis or biliary ductal dilatation. Pancreas:  No mass or inflammatory changes. Spleen: Within normal limits in size and appearance. Adrenals/Urinary Tract: No masses identified. No evidence of hydronephrosis. Unremarkable urinary bladder. Stomach/Bowel: Postop changes from right colectomy noted. Soft tissue stranding is seen within the right abdominal mesenteric fat in the region of the operative bed and a small amount of free fluid is seen along the right paracolic gutter. A small rim enhancing fluid collection is seen in the right abdominal retroperitoneum adjacent to the duodenum and IVC, which measures 2.5 x 1.7 cm on image 51/2. This is suspicious for small abscess. Diffuse moderate dilatation of small bowel loops is seen containing air-fluid levels. There is also mild colonic distention with air-fluid levels throughout the colon to the level the rectum. This is most consistent with a postop ileus. Vascular/Lymphatic: No pathologically enlarged lymph nodes. No abdominal aortic aneurysm. Reproductive:  No mass identified. Other:  None. Musculoskeletal:  No suspicious bone lesions identified. IMPRESSION: Postop changes from right colectomy. Small amount of free fluid in right paracolic gutter, and 2.5 cm right retroperitoneal fluid collection adjacent to the IVC and duodenum, suspicious for small abscess. Probable moderate to severe postop ileus. No definite evidence of  bowel obstruction. Tiny bilateral pleural effusions and dependent atelectasis. Cholelithiasis.  No radiographic evidence of cholecystitis. Electronically Signed   By: JEarle GellM.D.   On: 04/24/2016 16:41   Dg Abd 2 Views  Result Date: 04/23/2016 CLINICAL DATA:  Recent ileocolonic resection with Meckel's diverticulum and appendectomy. Evaluate ileus. EXAM: ABDOMEN - 2 VIEW COMPARISON:  04/22/2016 FINDINGS: Nasogastric tube has been removed. Multiple air-fluid levels throughout the abdomen with small bowel gaseous distension. No evidence for free air. Air-fluid level in the stomach. There may be some gas in the colon splenic flexure region. Few densities at the left lung base are suggestive for atelectasis. IMPRESSION: Persistent dilated loops of small bowel throughout the abdomen with air-fluid levels. A small amount of gas in the colon. Findings could represent a postoperative ileus. However, a partial small bowel obstruction cannot be excluded. No significant change in the small bowel distension. Electronically Signed   By: AMarkus DaftM.D.   On: 04/23/2016 15:54    Anti-infectives: Anti-infectives    Start     Dose/Rate Route Frequency Ordered Stop   04/24/16 1800  piperacillin-tazobactam (ZOSYN) IVPB 3.375 g     3.375 g 12.5 mL/hr over 240 Minutes Intravenous Every 8 hours 04/24/16 1716     04/14/16 1730  clindamycin (CLEOCIN) 900 mg, gentamicin (GARAMYCIN) 240 mg in sodium chloride 0.9 % 1,000 mL for intraperitoneal lavage  Status:  Discontinued      Intraperitoneal To Surgery 04/14/16 1719 04/14/16 2103   04/14/16 1545  piperacillin-tazobactam (ZOSYN) IVPB 3.375 g     3.375 g 12.5 mL/hr over 240 Minutes Intravenous Every 8 hours 04/14/16 1533 04/19/16 1159   04/14/16 1530  piperacillin-tazobactam (ZOSYN) IVPB 3.375 g     3.375 g 12.5 mL/hr over 240 Minutes Intravenous  Once 04/14/16 1529 04/14/16 1946   04/14/16 1515  Ampicillin-Sulbactam (UNASYN) 3 g in sodium chloride 0.9 % 100 mL IVPB   Status:  Discontinued     3 g 200 mL/hr over 30 Minutes Intravenous  Once 04/14/16 1514 04/14/16 1529   04/14/16 1515  Ampicillin-Sulbactam (UNASYN) 3 g in sodium chloride 0.9 % 100 mL IVPB  Status:  Discontinued     3 g 200 mL/hr over 30 Minutes Intravenous  Once 04/14/16 1514 04/14/16 1529      Assessment/Plan: Problem List: Patient Active Problem List   Diagnosis Date Noted  . Pre-diabetes 04/16/2016  . Acute gangrenous appendicitis with perforation and peritonitis s/p ileocolectomy 04/14/2016 04/14/2016  . Hyperglycemia 04/14/2016  . Meckels diverticulum s/p diverticulectomy 04/14/2016 04/14/2016    Discussed with IR.Marland Kitchenthey doubted that a drain could be left.  If he remains symptomatic will aspirate it.   11 Days Post-Op    LOS: 11 days   Matt B. Hassell Done, MD, [facility] [phone] beeper [phone]  04/25/2016 10:53 AM

## 2016-04-26 ENCOUNTER — Inpatient Hospital Stay (HOSPITAL_COMMUNITY): Payer: Medicaid Other

## 2016-04-26 ENCOUNTER — Encounter (HOSPITAL_COMMUNITY): Payer: Self-pay | Admitting: Radiology

## 2016-04-26 LAB — BASIC METABOLIC PANEL
Anion gap: 8 (ref 5–15)
BUN: 21 mg/dL — ABNORMAL HIGH (ref 6–20)
CHLORIDE: 95 mmol/L — AB (ref 101–111)
CO2: 28 mmol/L (ref 22–32)
CREATININE: 0.98 mg/dL (ref 0.61–1.24)
Calcium: 8.5 mg/dL — ABNORMAL LOW (ref 8.9–10.3)
GFR calc non Af Amer: >60 mL/min (ref >60)
GLUCOSE: 152 mg/dL — AB (ref 65–99)
Potassium: 4.2 mmol/L (ref 3.5–5.1)
Sodium: 131 mmol/L — ABNORMAL LOW (ref 135–145)

## 2016-04-26 LAB — CBC
HEMATOCRIT: 38.2 % — AB (ref 39.0–52.0)
Hemoglobin: 12.6 g/dL — ABNORMAL LOW (ref 13.0–17.0)
MCH: 31 pg (ref 26.0–34.0)
MCHC: 33 g/dL (ref 30.0–36.0)
MCV: 93.9 fL (ref 78.0–100.0)
PLATELETS: 759 10*3/uL — AB (ref 150–400)
RBC: 4.07 MIL/uL — ABNORMAL LOW (ref 4.22–5.81)
RDW: 13.2 % (ref 11.5–15.5)
WBC: 18.7 10*3/uL — AB (ref 4.0–10.5)

## 2016-04-26 LAB — GLUCOSE, CAPILLARY
GLUCOSE-CAPILLARY: 155 mg/dL — AB (ref 65–99)
GLUCOSE-CAPILLARY: 148 mg/dL — AB (ref 65–99)
Glucose-Capillary: 145 mg/dL — ABNORMAL HIGH (ref 65–99)
Glucose-Capillary: 143 mg/dL — ABNORMAL HIGH (ref 65–99)

## 2016-04-26 MED ORDER — INSULIN ASPART 100 UNIT/ML ~~LOC~~ SOLN
0.0000 [IU] | Freq: Four times a day (QID) | SUBCUTANEOUS | Status: DC
  Administered 2016-04-26 (×2): 1 [IU] via SUBCUTANEOUS
  Administered 2016-04-27 (×4): 2 [IU] via SUBCUTANEOUS
  Administered 2016-04-28: 1 [IU] via SUBCUTANEOUS

## 2016-04-26 MED ORDER — FENTANYL CITRATE (PF) 100 MCG/2ML IJ SOLN
INTRAMUSCULAR | Status: AC
  Filled 2016-04-26: qty 4

## 2016-04-26 MED ORDER — FAT EMULSION 20 % IV EMUL
240.0000 mL | INTRAVENOUS | Status: AC
  Administered 2016-04-26: 240 mL via INTRAVENOUS
  Filled 2016-04-26: qty 250

## 2016-04-26 MED ORDER — MIDAZOLAM HCL 2 MG/2ML IJ SOLN
INTRAMUSCULAR | Status: AC | PRN
  Administered 2016-04-26 (×2): 1 mg via INTRAVENOUS

## 2016-04-26 MED ORDER — SODIUM CHLORIDE 0.9 % IV SOLN
INTRAVENOUS | Status: DC
  Administered 2016-04-26: 10:00:00 via INTRAVENOUS

## 2016-04-26 MED ORDER — MIDAZOLAM HCL 2 MG/2ML IJ SOLN
INTRAMUSCULAR | Status: AC
  Filled 2016-04-26: qty 6

## 2016-04-26 MED ORDER — TRACE MINERALS CR-CU-MN-SE-ZN 10-1000-500-60 MCG/ML IV SOLN
INTRAVENOUS | Status: AC
  Administered 2016-04-26: 18:00:00 via INTRAVENOUS
  Filled 2016-04-26: qty 1992

## 2016-04-26 MED ORDER — FENTANYL CITRATE (PF) 100 MCG/2ML IJ SOLN
INTRAMUSCULAR | Status: AC | PRN
  Administered 2016-04-26: 50 ug via INTRAVENOUS

## 2016-04-26 NOTE — Progress Notes (Signed)
Pt c/o chills and "cold sweat". Clammy. Temp 97.4. Recent CBG 147. Denied pain. Bandaide at site of aspiration clean, dry and intact. Resting quietly now.

## 2016-04-26 NOTE — Procedures (Signed)
Interventional Radiology Procedure Note  Procedure: CT aspiration of small fluid collection in the right retroperitoneal space, upper abdomen.  ~2cc purulent fluid aspirated.  No drain placed..  Complications: None Recommendations:  - follow up cultures - Do not submerge for 7 days - Routine wound care   Signed,  Yvone NeuJaime S. Loreta AveWagner, DO

## 2016-04-26 NOTE — Progress Notes (Signed)
Patient has been complaining of having "cold sweats" throughout this shift. He has stated that he is "freezing", but sweating everywhere. He is afebrile and vital signs are stable. Will continue to monitor.

## 2016-04-26 NOTE — Consult Note (Signed)
Chief Complaint: Patient was seen in consultation today for perforated appendicitis  Referring Physician(s):  Dr. Luretha Murphy  Supervising Physician: Gilmer Mor  Patient Status: [facility] - In-pt  History of Present Illness: Aaron Trujillo is a 45 y.o. male with no significant past medical history who presented to hospital with abdominal pain.  Patient was found to have appendicitis with focal perforation taken to the OR by Dr. Michaell Cowing (11/23) for lap-assisted ileocolonic resection, omentopexy of ileocolonic anastomosis, and meckel's diverticulectomy. Patient developed a small bowel obstruction post-op.  He continued with distention and abdominal pain.  He did have an NGT for decompression, however this has been removed. He has been receiving TPN for nutrition support during acute illness.  Patient's WBC increased from 13.0  22.6 (12/3).  A CT scan showed a small fluid collection thought to be an abscess.   CT Abd/Pelvis IMPRESSION: Postop changes from right colectomy. Small amount of free fluid in right paracolic gutter, and 2.5 cm right retroperitoneal fluid collection adjacent to the IVC and duodenum, suspicious for small abscess.  IR consulted by surgery (12/4). Due to size of collection, doubtful a drain could be left, however aspiration potentially possible if patient remained symptomatic.   Dr. Loreta Ave has reviewed CT scan again today in light of patient continuing to be symptomatic and feels patient is appropriate to proceed with aspiration attempt.   Past Medical History:  Diagnosis Date  . Pre-diabetes 04/16/2016    Past Surgical History:  Procedure Laterality Date  . LAPAROSCOPIC APPENDECTOMY N/A 04/14/2016   Procedure: ileocolonic resection with Meckel's diverticulectomy;  Surgeon: Karie Soda, MD;  Location: [facility];  Service: General;  Laterality: N/A;    Allergies: Patient has no known allergies.  Medications: Prior to Admission medications   Medication Sig  Start Date End Date Taking? Authorizing Provider  Pseudoephedrine-Guaifenesin (MUCINEX D MAX STRENGTH) [phone] MG TB12 Take 1 tablet by mouth 2 (two) times daily as needed (for cough/congestion).   Yes Historical Provider, MD  simethicone (MYLICON) 80 MG chewable tablet Chew 160 mg by mouth every 6 (six) hours as needed for flatulence.   Yes Historical Provider, MD  naproxen (NAPROSYN) 500 MG tablet Take 1 tablet (500 mg total) by mouth every 12 (twelve) hours as needed for mild pain or moderate pain. 04/14/16   Karie Soda, MD  oxyCODONE (OXY IR/ROXICODONE) 5 MG immediate release tablet Take 1-2 tablets (5-10 mg total) by mouth every 4 (four) hours as needed for moderate pain, severe pain or breakthrough pain. 04/14/16   Karie Soda, MD     No family history on file.  Social History   Social History  . Marital status: Married    Spouse name: N/A  . Number of children: N/A  . Years of education: N/A   Social History Main Topics  . Smoking status: Former Games developer  . Smokeless tobacco: Never Used  . Alcohol use No  . Drug use: Unknown  . Sexual activity: Not Asked   Other Topics Concern  . None   Social History Narrative  . None    Review of Systems: A 12 point ROS discussed and pertinent positives are indicated in the HPI above.  All other systems are negative.  Review of Systems  Constitutional: Positive for appetite change, diaphoresis and fatigue.  Respiratory: Negative for cough and shortness of breath.   Cardiovascular: Negative for chest pain.  Gastrointestinal: Positive for abdominal distention and abdominal pain.  Psychiatric/Behavioral: Negative for behavioral problems and confusion.  Vital Signs: BP 116/83 (BP Location: Left Arm)   Pulse 89   Temp 98 F (36.7 C) (Oral)   Resp 16   Ht 5\' 11"  (1.803 m)   Wt 211 lb 6.4 oz (95.9 kg)   SpO2 97%   BMI 29.48 kg/m   Physical Exam  Constitutional: He is oriented to person, place, and time. He appears  well-developed.  Cardiovascular: Normal rate and regular rhythm.   Pulmonary/Chest: Effort normal and breath sounds normal.  Abdominal: He exhibits distension. There is tenderness.  Neurological: He is alert and oriented to person, place, and time.  Skin: Skin is warm and dry.  Psychiatric: He has a normal mood and affect. His behavior is normal. Judgment and thought content normal.  Nursing note and vitals reviewed.   Mallampati Score:  MD Evaluation Airway: WNL Heart: WNL Abdomen: Other (comments) Abdomen comments: distended Chest/ Lungs: WNL ASA  Classification: 3 Mallampati/Airway Score: Two  Imaging: Dg Abd 1 View  Result Date: 04/22/2016 CLINICAL DATA:  Small bowel obstruction. EXAM: ABDOMEN - 1 VIEW COMPARISON:  Radiograph of April 21, 2016. FINDINGS: Nasogastric tube tip is seen in expected position of distal stomach. Mildly dilated small bowel loops are seen in left side of the abdomen which are slightly improved compared to prior exam, and may represent improving ileus or possibly obstruction. No calcifications are noted. IMPRESSION: Mildly dilated small bowel loops are noted in left side of abdomen which are slightly improved compared to prior exam, suggesting improving ileus or possibly improving distal small bowel obstruction. Electronically Signed   By: Lupita RaiderJames  Green Jr, M.D.   On: 04/22/2016 07:52   Dg Abd 1 View  Result Date: 04/21/2016 CLINICAL DATA:  Follow up small bowel obstruction EXAM: ABDOMEN - 1 VIEW COMPARISON:  04/20/2016 FINDINGS: Nasogastric catheter remains coiled within the stomach. Scattered dilated loops of small bowel are again identified N mildly improved when compared with the prior study. Colonic gas is again noted. IMPRESSION: Mild improvement in the degree of small bowel dilatation. Electronically Signed   By: Alcide CleverMark  Lukens M.D.   On: 04/21/2016 07:28   Ct Abdomen Pelvis W Contrast  Result Date: 04/24/2016 CLINICAL DATA:  Ten days postop from  ileocolonic resection for Meckel's diverticulum and appendectomy. Dilated bowel loops on abdominal radiographs. Leukocytosis. EXAM: CT ABDOMEN AND PELVIS WITH CONTRAST TECHNIQUE: Multidetector CT imaging of the abdomen and pelvis was performed using the standard protocol following bolus administration of intravenous contrast. CONTRAST:  100mL ISOVUE-300 IOPAMIDOL (ISOVUE-300) INJECTION 61% COMPARISON:  CT on 04/14/2016 FINDINGS: Lower Chest: Tiny bilateral pleural effusions and dependent bibasilar atelectasis. Hepatobiliary: No masses identified. Tiny less than 5 mm calcified gallstone again noted, however there is no evidence of acute cholecystitis or biliary ductal dilatation. Pancreas:  No mass or inflammatory changes. Spleen: Within normal limits in size and appearance. Adrenals/Urinary Tract: No masses identified. No evidence of hydronephrosis. Unremarkable urinary bladder. Stomach/Bowel: Postop changes from right colectomy noted. Soft tissue stranding is seen within the right abdominal mesenteric fat in the region of the operative bed and a small amount of free fluid is seen along the right paracolic gutter. A small rim enhancing fluid collection is seen in the right abdominal retroperitoneum adjacent to the duodenum and IVC, which measures 2.5 x 1.7 cm on image 51/2. This is suspicious for small abscess. Diffuse moderate dilatation of small bowel loops is seen containing air-fluid levels. There is also mild colonic distention with air-fluid levels throughout the colon to the level the rectum.  This is most consistent with a postop ileus. Vascular/Lymphatic: No pathologically enlarged lymph nodes. No abdominal aortic aneurysm. Reproductive:  No mass identified. Other:  None. Musculoskeletal:  No suspicious bone lesions identified. IMPRESSION: Postop changes from right colectomy. Small amount of free fluid in right paracolic gutter, and 2.5 cm right retroperitoneal fluid collection adjacent to the IVC and  duodenum, suspicious for small abscess. Probable moderate to severe postop ileus. No definite evidence of bowel obstruction. Tiny bilateral pleural effusions and dependent atelectasis. Cholelithiasis.  No radiographic evidence of cholecystitis. Electronically Signed   By: Myles RosenthalJohn  Stahl M.D.   On: 04/24/2016 16:41   Ct Abdomen Pelvis W Contrast  Result Date: 04/14/2016 CLINICAL DATA:  rlq pain started today eval for appy^14800mL ISOVUE-300 IOPAMIDOL (ISOVUE-300) INJECTION 61%, 30mL ISOVUE-300 IOPAMIDOL (ISOVUE-300) INJECTION 61% EXAM: CT ABDOMEN AND PELVIS WITH CONTRAST TECHNIQUE: Multidetector CT imaging of the abdomen and pelvis was performed using the standard protocol following bolus administration of intravenous contrast. CONTRAST:  100mL ISOVUE-300 IOPAMIDOL (ISOVUE-300) INJECTION 61%, 30mL ISOVUE-300 IOPAMIDOL (ISOVUE-300) INJECTION 61% COMPARISON:  None. FINDINGS: Lower chest: Lung bases are clear. Hepatobiliary: No focal hepatic lesion. Tiny gallstones within lumen gallbladder. No gallbladder distension. Pancreas: Pancreas is normal. No ductal dilatation. No pancreatic inflammation. Spleen: Normal spleen Adrenals/urinary tract: Adrenal glands and kidneys are normal. The ureters and bladder normal. Stomach/Bowel: Small hiatal hernia. Stomach, duodenum small bowel are normal. The cecum is positioned high at the level of the RIGHT hepatic lobe. There is extensive inflammation involving the cecum. The appendix is distended to 10 mm in diameter. There is an at appendicolith at the orifice of the appendix (image 90). There is a small gas collection positioned between the tip of the appendix and the cecum measuring 2.5 by 1.8 by 4.3 cm. This finding is most consistent with a perforated acute appendicitis. The appendix extends in a retrocecal fashion towards the inferior margin the RIGHT hepatic lobe. There is scattered small foci of intraperitoneal free air in the RIGHT upper quadrant (image 35, series 3 and on  image 52, series 2). Small amount fluid along the pericolic gutter. Remainder of the colon is normal. Vascular/Lymphatic: Abdominal aorta is normal caliber. There is no retroperitoneal or periportal lymphadenopathy. No pelvic lymphadenopathy. Reproductive: Prostate normal Other: Smaller free fluid along the RIGHT pericolic gutter Musculoskeletal: No aggressive osseous lesion. IMPRESSION: 1. Findings consistent with acute perforated appendicitis. 2. Small gas collection positioned between the cecum and the appendix and scattered intraperitoneal free air in the RIGHT abdomen. 3. No abscess formation. 4. Cecum is positioned high at the inferior margin the RIGHT hepatic lobe. 5. Small nonobstructing gallstone. Findings conveyed toROBERT BROWNING on 04/14/2016  at13:06. Electronically Signed   By: Genevive BiStewart  Edmunds M.D.   On: 04/14/2016 13:07   Dg Abd 2 Views  Result Date: 04/23/2016 CLINICAL DATA:  Recent ileocolonic resection with Meckel's diverticulum and appendectomy. Evaluate ileus. EXAM: ABDOMEN - 2 VIEW COMPARISON:  04/22/2016 FINDINGS: Nasogastric tube has been removed. Multiple air-fluid levels throughout the abdomen with small bowel gaseous distension. No evidence for free air. Air-fluid level in the stomach. There may be some gas in the colon splenic flexure region. Few densities at the left lung base are suggestive for atelectasis. IMPRESSION: Persistent dilated loops of small bowel throughout the abdomen with air-fluid levels. A small amount of gas in the colon. Findings could represent a postoperative ileus. However, a partial small bowel obstruction cannot be excluded. No significant change in the small bowel distension. Electronically Signed   By:  Richarda Overlie M.D.   On: 04/23/2016 15:54   Dg Abd 2 Views  Result Date: 04/20/2016 CLINICAL DATA:  Postoperative nausea and vomiting. EXAM: ABDOMEN - 2 VIEW COMPARISON:  Radiographs April 19, 2016. FINDINGS: Distal tip of nasogastric tube is seen in  expected position of the distal stomach. Stable small bowel dilatation is noted concerning for distal small bowel obstruction. No colonic dilatation is noted. No free air is noted. IMPRESSION: Stable small bowel dilatation is noted concerning for distal small bowel obstruction. Electronically Signed   By: Lupita Raider, M.D.   On: 04/20/2016 07:23   Dg Abd 2 Views  Result Date: 04/19/2016 CLINICAL DATA:  Postop nausea and vomiting EXAM: ABDOMEN - 2 VIEW COMPARISON:  CT 04/14/2016 FINDINGS: NG tube is in place with the tip in the distal stomach or proximal duodenum. There are dilated small bowel loops with air-fluid levels compatible with small bowel obstruction. No free air organomegaly. IMPRESSION: Dilated small bowel loops with air-fluid levels compatible with small bowel obstruction. Electronically Signed   By: Charlett Nose M.D.   On: 04/19/2016 07:29    Labs:  CBC:  Recent Labs  04/22/16 0424 04/24/16 0347 04/25/16 0411 04/26/16 0853  WBC 13.0* 20.0* 22.6* 18.7*  HGB 12.0* 12.4* 13.0 12.6*  HCT 36.1* 36.7* 39.6 38.2*  PLT 619* 736* 778* 759*    COAGS: No results for input(s): INR, APTT in the last 8760 hours.  BMP:  Recent Labs  04/23/16 0347 04/24/16 0347 04/25/16 0411 04/26/16 0446  NA 136 134* 130* 131*  K 3.5 3.8 4.2 4.2  CL 100* 102 98* 95*  CO2 26 25 25 28   GLUCOSE 169* 150* 141* 152*  BUN 16 20 21* 21*  CALCIUM 8.6* 8.2* 8.5* 8.5*  CREATININE 0.78 0.81 0.87 0.98  GFRNONAA >60 >60 >60 >60  GFRAA >60 >60 >60 >60    LIVER FUNCTION TESTS:  Recent Labs  04/14/16 1105 04/22/16 0424 04/25/16 0411  BILITOT 2.3* 1.0 0.9  AST 27 26 25   ALT 31 29 29   ALKPHOS 74 69 88  PROT 8.0 6.7 7.7  ALBUMIN 4.7 2.6* 3.4*    TUMOR MARKERS: No results for input(s): AFPTM, CEA, CA199, CHROMGRNA in the last 8760 hours.  Assessment and Plan:  Gangrenous appendicitis with perforation s/p lap-assisted ileocolonic resection, omentopexy of ileocolonic anastomosis,  meckel's diverticulectomy 04/14/16 Patient now with abdominal fluid collection identified on CT scan (04/25/16) and continues to be symptomatic WBC increased (18.7 today). Dr Loreta Ave discussed case with surgical team regarding aspiration of fluid with possible drain placement.  Unlikely a drain could be placed due to suspected size of collection, however is agreeable to proceed with possible aspiration.  Patient will be scheduled for procedure today.  Risks and benefits discussed with the patient including bleeding, infection, damage to adjacent structures, bowel perforation/fistula connection, and sepsis. All of the patient's questions were answered, patient is agreeable to proceed. Consent signed and in chart.   Thank you for this interesting consult.  I greatly enjoyed meeting Aaron Trujillo and look forward to participating in their care.  A copy of this report was sent to the requesting provider on this date.  Electronically Signed: Hoyt Koch 04/26/2016, 10:38 AM   I spent a total of 40 Minutes  in face to face in clinical consultation, greater than 50% of which was counseling/coordinating care for perforated appendicitis.

## 2016-04-26 NOTE — Progress Notes (Signed)
PHARMACY - ADULT TOTAL PARENTERAL NUTRITION CONSULT NOTE   Pharmacy Consult for TPN Indication: bowel obstruction  Patient Measurements: Height: 5\' 11"  (180.3 cm) Weight: 211 lb 6.4 oz (95.9 kg) IBW/kg (Calculated) : 75.3   Body mass index is 29.48 kg/m. Usual Weight:  Adjusted weight 84 kg  Insulin Requirements/ 24 hrs: 5 units (on sensitive SSI)  Current Nutrition: back to NPO on 12/4  IVF: LR 10 ml/hr  Central access: PICC line ordered 11/30 TPN start date: 11/30  ASSESSMENT                                                                                                          HPI: [id number] presented to ED on 11/23 with abdominal pain and CT showing appendicitis with focal perforation.  Surgery consulted and found to have gangrenous appendicitis with perforation, focal peritonitis, and Meckel's Diverticulum.  S/p Lap-assisted ileocolonic resection, Omentopexy of ileocolonic anastomosis, Meckel's diverticulectomyon 11/23.  Pharmacy is now consulted to dose TPN for suspected bowel obstruction.  Significant events:  12/1 Dr. Johna Trujillo reports that patient is tolerating small amounts of liquids, but will likely need TPN nutrition support for several days. NGT d/ced. Adv to full liquids 12/2: limited oral intake 12/3: CT shows 2.5 cm abscess right retroperitoneum, WBC elevated at 20k, Zosyn restarted. 12/4: Back to NPO status as may need aspiration of abscess. 12/5: Remains NPO for possible aspiration of right retroperitoneal fluid collection today by IR.  Today:   Glucose: CBGs near goal of <150; A1c = 6.1  Electrolytes:  Na slightly low at 131; all other lytes are WNL.   Renal:  SCr wnl (CrCl > 100 ml/min)  LFTs: WNL  TGs: 169 (12/1), 177 (12/5)  Prealbumin: 12.1 (12/1), 10.7 (12/5)  NUTRITIONAL GOALS                                                                                             RD recs (revised 12/4): 2000-2200 kcal/day, 95-105 g protein/day, > 2L  fluid/day  Clinimix 5/15 at a goal rate of 83 ml/hr + 20% fat emulsion at 10 ml/hr to provide: 100 g/day protein, 1894 Kcal/day. This meets 100% of minimum protein and 95% of minimum kCal needs  Clinimix E 5/20 2L and E 5/15 1L are currently unavailable d/t Sport and exercise psychologistnational shortage. Clinimix E 5/20 will be used to begin therapy (1L bag), then transition to Clinimix E 5/15.    PLAN  At 1800 today:  contiune Clinimix E 5/15 at 83 ml/hr.  Continue 20% fat emulsion at 9010ml/hr.  TPN to contain standard multivitamins and trace elements.  Continue CBGs and sensitive SSI - change to q6h  Add IVF for low Na - NS at 20 ml/hr  TPN lab panels on Mondays & Thursdays.  F/u daily, monitor for ability to transition to oral nutrition  +++ Clinimix is in critical shortage and pharmacy has very limited supply in stock. If Appropriate, consider switching patient off TPN as soon as possible+++  Aaron Trujillo, PharmD, BCPS Pager: [phone] 04/26/2016 9:09 AM

## 2016-04-26 NOTE — Progress Notes (Signed)
[facility] Progress Note  12 Days Post-Op  Subjective: Night sweats past two nights. Dull back pain improving. Minimal flatus. No BM in 48 hours. Denies nausea or vomiting. Having abdominal soreness with movement but no sharp abdominal pains.   Objective: Vital signs in last 24 hours: Temp:  [98 F (36.7 C)-98.8 F (37.1 C)] 98 F (36.7 C) (12/05 0527) Pulse Rate:  [88-110] 89 (12/05 0527) Resp:  [16-18] 16 (12/05 0527) BP: (116-120)/(76-83) 116/83 (12/05 0527) SpO2:  [95 %-99 %] 97 % (12/05 0527) Weight:  [211 lb 6.4 oz (95.9 kg)] 211 lb 6.4 oz (95.9 kg) (12/05 0636) Last BM Date: 04/24/16  Intake/Output from previous day: 12/04 0701 - 12/05 0700 In: 1805.9 [I.V.:1755.9; IV Piggyback:50] Out: 1400 [Urine:1400] Intake/Output this shift: No intake/output data recorded.  PE: Gen:  Alert, NAD, pleasant and cooperative  Card:  RRR, no M/G/R appreciated Pulm:  CTABL Abd: Soft, appropriately tender, moderately distended and tympanic,+BS, incisions C/D/I  Ext:  No erythema, edema, or tenderness    Lab Results:   Recent Labs  04/24/16 0347 04/25/16 0411  WBC 20.0* 22.6*  HGB 12.4* 13.0  HCT 36.7* 39.6  PLT 736* 778*   BMET  Recent Labs  04/25/16 0411 04/26/16 0446  NA 130* 131*  K 4.2 4.2  CL 98* 95*  CO2 25 28  GLUCOSE 141* 152*  BUN 21* 21*  CREATININE 0.87 0.98  CALCIUM 8.5* 8.5*   PT/INR No results for input(s): LABPROT, INR in the last 72 hours. CMP     Component Value Date/Time   NA 131 (L) 04/26/2016 0446   K 4.2 04/26/2016 0446   CL 95 (L) 04/26/2016 0446   CO2 28 04/26/2016 0446   GLUCOSE 152 (H) 04/26/2016 0446   BUN 21 (H) 04/26/2016 0446   CREATININE 0.98 04/26/2016 0446   CALCIUM 8.5 (L) 04/26/2016 0446   PROT 7.7 04/25/2016 0411   ALBUMIN 3.4 (L) 04/25/2016 0411   AST 25 04/25/2016 0411   ALT 29 04/25/2016 0411   ALKPHOS 88 04/25/2016 0411   BILITOT 0.9 04/25/2016 0411   GFRNONAA >60 04/26/2016 0446   GFRAA >60  04/26/2016 0446   Lipase     Component Value Date/Time   LIPASE 18 04/14/2016 1105       Studies/Results: Ct Abdomen Pelvis W Contrast  Result Date: 04/24/2016 CLINICAL DATA:  Ten days postop from ileocolonic resection for Meckel's diverticulum and appendectomy. Dilated bowel loops on abdominal radiographs. Leukocytosis. EXAM: CT ABDOMEN AND PELVIS WITH CONTRAST TECHNIQUE: Multidetector CT imaging of the abdomen and pelvis was performed using the standard protocol following bolus administration of intravenous contrast. CONTRAST:  100mL ISOVUE-300 IOPAMIDOL (ISOVUE-300) INJECTION 61% COMPARISON:  CT on 04/14/2016 FINDINGS: Lower Chest: Tiny bilateral pleural effusions and dependent bibasilar atelectasis. Hepatobiliary: No masses identified. Tiny less than 5 mm calcified gallstone again noted, however there is no evidence of acute cholecystitis or biliary ductal dilatation. Pancreas:  No mass or inflammatory changes. Spleen: Within normal limits in size and appearance. Adrenals/Urinary Tract: No masses identified. No evidence of hydronephrosis. Unremarkable urinary bladder. Stomach/Bowel: Postop changes from right colectomy noted. Soft tissue stranding is seen within the right abdominal mesenteric fat in the region of the operative bed and a small amount of free fluid is seen along the right paracolic gutter. A small rim enhancing fluid collection is seen in the right abdominal retroperitoneum adjacent to the duodenum and IVC, which measures 2.5 x 1.7 cm on image 51/2. This is suspicious for  small abscess. Diffuse moderate dilatation of small bowel loops is seen containing air-fluid levels. There is also mild colonic distention with air-fluid levels throughout the colon to the level the rectum. This is most consistent with a postop ileus. Vascular/Lymphatic: No pathologically enlarged lymph nodes. No abdominal aortic aneurysm. Reproductive:  No mass identified. Other:  None. Musculoskeletal:  No  suspicious bone lesions identified. IMPRESSION: Postop changes from right colectomy. Small amount of free fluid in right paracolic gutter, and 2.5 cm right retroperitoneal fluid collection adjacent to the IVC and duodenum, suspicious for small abscess. Probable moderate to severe postop ileus. No definite evidence of bowel obstruction. Tiny bilateral pleural effusions and dependent atelectasis. Cholelithiasis.  No radiographic evidence of cholecystitis. Electronically Signed   By: Myles RosenthalJohn  Stahl M.D.   On: 04/24/2016 16:41    Anti-infectives: Anti-infectives    Start     Dose/Rate Route Frequency Ordered Stop   04/24/16 1800  piperacillin-tazobactam (ZOSYN) IVPB 3.375 g     3.375 g 12.5 mL/hr over 240 Minutes Intravenous Every 8 hours 04/24/16 1716     04/14/16 1730  clindamycin (CLEOCIN) 900 mg, gentamicin (GARAMYCIN) 240 mg in sodium chloride 0.9 % 1,000 mL for intraperitoneal lavage  Status:  Discontinued      Intraperitoneal To Surgery 04/14/16 1719 04/14/16 2103   04/14/16 1545  piperacillin-tazobactam (ZOSYN) IVPB 3.375 g     3.375 g 12.5 mL/hr over 240 Minutes Intravenous Every 8 hours 04/14/16 1533 04/19/16 1159   04/14/16 1530  piperacillin-tazobactam (ZOSYN) IVPB 3.375 g     3.375 g 12.5 mL/hr over 240 Minutes Intravenous  Once 04/14/16 1529 04/14/16 1946   04/14/16 1515  Ampicillin-Sulbactam (UNASYN) 3 g in sodium chloride 0.9 % 100 mL IVPB  Status:  Discontinued     3 g 200 mL/hr over 30 Minutes Intravenous  Once 04/14/16 1514 04/14/16 1529   04/14/16 1515  Ampicillin-Sulbactam (UNASYN) 3 g in sodium chloride 0.9 % 100 mL IVPB  Status:  Discontinued     3 g 200 mL/hr over 30 Minutes Intravenous  Once 04/14/16 1514 04/14/16 1529     Assessment/Plan gangrenous appendicitis with perforation S/P Lap-assisted ileocolonic resection, omentopexy of ileocolonic anastomosis, meckel's diverticulectomy 04/14/16  Dr. Karie SodaSteven Gross  POD#12  WBC increased to 22.6 on 12/4  CT Abd 12/3  significant for 2.5 cm right retroperitoneal fluid collection  Patient with continued symptoms this AM - diaphoresis, ileus, mild pain; will call IR regarding aspiration of fluid  Dispo: NPO, Possible aspiration of right retroperitoneal fluid collection today by Interventional radiology, will discuss with Dr. Loreta AveWagner     LOS: 12 days    Adam PhenixElizabeth S Demetries Coia , [facility] 04/26/2016, 8:28 AM Pager: [phone] Consults: [phone] Mon-Fri 7:00 am-4:30 pm Sat-Sun 7:00 am-11:30 am

## 2016-04-27 LAB — GLUCOSE, CAPILLARY
GLUCOSE-CAPILLARY: 162 mg/dL — AB (ref 65–99)
GLUCOSE-CAPILLARY: 184 mg/dL — AB (ref 65–99)
Glucose-Capillary: 152 mg/dL — ABNORMAL HIGH (ref 65–99)
Glucose-Capillary: 157 mg/dL — ABNORMAL HIGH (ref 65–99)

## 2016-04-27 MED ORDER — TRACE MINERALS CR-CU-MN-SE-ZN 10-1000-500-60 MCG/ML IV SOLN
INTRAVENOUS | Status: DC
  Administered 2016-04-27: 18:00:00 via INTRAVENOUS
  Filled 2016-04-27: qty 1992

## 2016-04-27 MED ORDER — FAT EMULSION 20 % IV EMUL
250.0000 mL | INTRAVENOUS | Status: DC
  Administered 2016-04-27: 250 mL via INTRAVENOUS
  Filled 2016-04-27: qty 250

## 2016-04-27 MED ORDER — ENOXAPARIN SODIUM 40 MG/0.4ML ~~LOC~~ SOLN
40.0000 mg | Freq: Every day | SUBCUTANEOUS | Status: DC
  Administered 2016-04-27 – 2016-05-01 (×5): 40 mg via SUBCUTANEOUS
  Filled 2016-04-27 (×6): qty 0.4

## 2016-04-27 MED ORDER — GLUCERNA SHAKE PO LIQD
237.0000 mL | Freq: Three times a day (TID) | ORAL | Status: DC
  Administered 2016-04-27 – 2016-04-30 (×7): 237 mL via ORAL
  Administered 2016-05-02: 20:00:00 via ORAL
  Filled 2016-04-27 (×19): qty 237

## 2016-04-27 NOTE — Progress Notes (Signed)
PHARMACY - ADULT TOTAL PARENTERAL NUTRITION CONSULT NOTE   Pharmacy Consult for TPN Indication: bowel obstruction  Patient Measurements: Height: 5\' 11"  (180.3 cm) Weight: 211 lb 6.4 oz (95.9 kg) IBW/kg (Calculated) : 75.3   Body mass index is 29.48 kg/m. Usual Weight:  Adjusted weight 84 kg  Insulin Requirements/ 24 hrs: 8 units (on sensitive SSI)  Current Nutrition: CLD  IVF: NS 20 ml/hr  Central access: PICC line ordered 11/30 TPN start date: 11/30  ASSESSMENT                                                                                                          HPI: [id number] yoM presented to ED on 11/23 with abdominal pain and CT showing appendicitis with focal perforation.  Surgery consulted and found to have gangrenous appendicitis with perforation, focal peritonitis, and Meckel's Diverticulum.  S/p Lap-assisted ileocolonic resection, Omentopexy of ileocolonic anastomosis, Meckel's diverticulectomyon 11/23.  Pharmacy is now consulted to dose TPN for suspected bowel obstruction.  Significant events:  12/1 Dr. Johna SheriffHoxworth reports that patient is tolerating small amounts of liquids, but will likely need TPN nutrition support for several days. NGT d/ced. Adv to full liquids 12/2: limited oral intake 12/3: CT shows 2.5 cm abscess right retroperitoneum, WBC elevated at 20k, Zosyn restarted. 12/4: Back to NPO status as may need aspiration of abscess. 12/5: Remains NPO for possible aspiration of right retroperitoneal fluid collection today by IR.  Today: (last labs 12/5)  Glucose: CBGs near goal of <150; A1c = 6.1  Electrolytes:  Na slightly low at 131; all other lytes are WNL.   Renal:  SCr wnl (CrCl > 100 ml/min)  LFTs: WNL  TGs: 169 (12/1), 177 (12/5)  Prealbumin: 12.1 (12/1), 10.7 (12/5)  NUTRITIONAL GOALS                                                                                             RD recs (revised 12/4): 2000-2200 kcal/day, 95-105 g protein/day, > 2L  fluid/day  Clinimix 5/15 at a goal rate of 83 ml/hr + 20% fat emulsion at 10 ml/hr to provide: 100 g/day protein, 1894 Kcal/day. This meets 100% of minimum protein and 95% of minimum kCal needs  Clinimix E 5/20 2L and E 5/15 1L are currently unavailable d/t Sport and exercise psychologistnational shortage. Clinimix E 5/20 will be used to begin therapy (1L bag), then transition to Clinimix E 5/15.    PLAN  At 1800 today:  contiune Clinimix E 5/15 at 83 ml/hr.  Continue 20% fat emulsion at 4910ml/hr.  TPN to contain standard multivitamins and trace elements.  Continue CBGs and sensitive SSI -  q6h  continue IVF for low Na - NS at 20 ml/hr  TPN lab panels on Mondays & Thursdays.  F/u daily, monitor for ability to transition to oral nutrition  +++ Clinimix is in critical shortage and pharmacy has very limited supply in stock. If Appropriate, consider switching patient off TPN as soon as possible+++  Arley PhenixEllen Donicia Druck RPh 04/27/2016, 10:20 AM Pager [phone]

## 2016-04-27 NOTE — Progress Notes (Signed)
[facility] Progress Note  13 Days Post-Op  Subjective: Denies abdominal pain, eager to go home. Tolerating clears. Passed large amount of flatus last night, not much the AM. 3 loose BMs since lunch yesterday. Ambulating. Some soreness around aspiration site from procedure yesterday.   Objective: Vital signs in last 24 hours: Temp:  [97.4 F (36.3 C)-98.6 F (37 C)] 98.1 F (36.7 C) (12/06 0610) Pulse Rate:  [89-99] 93 (12/06 0610) Resp:  [15-34] 18 (12/06 0610) BP: (98-124)/(65-83) 118/79 (12/06 0610) SpO2:  [96 %-100 %] 97 % (12/06 0610) Last BM Date: 04/24/16  Intake/Output from previous day: 12/05 0701 - 12/06 0700 In: 2364 [P.O.:600; I.V.:1714; IV Piggyback:50] Out: 1701 [Urine:1700; Stool:1] Intake/Output this shift: No intake/output data recorded.  PE: Gen:  Alert, NAD, pleasant Abd: Soft, NT, distended - though improved compared to yesterday, +BS, midline incision c/d/i with nearly 100% beefy red granulation tissue. Ext:  No erythema, edema, or tenderness   Lab Results:   Recent Labs  04/25/16 0411 04/26/16 0853  WBC 22.6* 18.7*  HGB 13.0 12.6*  HCT 39.6 38.2*  PLT 778* 759*   BMET  Recent Labs  04/25/16 0411 04/26/16 0446  NA 130* 131*  K 4.2 4.2  CL 98* 95*  CO2 25 28  GLUCOSE 141* 152*  BUN 21* 21*  CREATININE 0.87 0.98  CALCIUM 8.5* 8.5*   PT/INR No results for input(s): LABPROT, INR in the last 72 hours. CMP     Component Value Date/Time   NA 131 (L) 04/26/2016 0446   K 4.2 04/26/2016 0446   CL 95 (L) 04/26/2016 0446   CO2 28 04/26/2016 0446   GLUCOSE 152 (H) 04/26/2016 0446   BUN 21 (H) 04/26/2016 0446   CREATININE 0.98 04/26/2016 0446   CALCIUM 8.5 (L) 04/26/2016 0446   PROT 7.7 04/25/2016 0411   ALBUMIN 3.4 (L) 04/25/2016 0411   AST 25 04/25/2016 0411   ALT 29 04/25/2016 0411   ALKPHOS 88 04/25/2016 0411   BILITOT 0.9 04/25/2016 0411   GFRNONAA >60 04/26/2016 0446   GFRAA >60 04/26/2016 0446   Lipase      Component Value Date/Time   LIPASE 18 04/14/2016 1105       Studies/Results: Ct Aspiration  Result Date: 04/26/2016 INDICATION: 45 year old male status post appendectomy in with ileus. Potential small abscess contributing to the ileus. He has been referred for aspiration. EXAM: CT GUIDANCE NEEDLE PLACEMENT MEDICATIONS: The patient is currently admitted to the hospital and receiving intravenous antibiotics. The antibiotics were administered within an appropriate time frame prior to the initiation of the procedure. ANESTHESIA/SEDATION: Fentanyl 3.0 mcg IV; Versed 50 mg IV Moderate Sedation Time:  10 The patient was continuously monitored during the procedure by the interventional radiology nurse under my direct supervision. COMPLICATIONS: None PROCEDURE: Informed written consent was obtained from the patient after a thorough discussion of the procedural risks, benefits and alternatives. All questions were addressed. Maximal Sterile Barrier Technique was utilized including caps, mask, sterile gowns, sterile gloves, sterile drape, hand hygiene and skin antiseptic. A timeout was performed prior to the initiation of the procedure. Patient is positioned left decubitus and a scout CT of the abdomen was performed for planning purposes. Once the patient is prepped and draped in the usual sterile fashion, 1% lidocaine was used for local anesthesia. Using CT guidance, a 20 cm 18 gauge trocar needle was advanced into small fluid collection adjacent to the duodenum. Stylet was removed and approximately 2 cc of purulent material was  aspirated. Needle was removed and a final image was stored. Patient tolerated the procedure well and remained hemodynamically stable throughout. No complications were encountered and no significant blood loss encountered. FINDINGS: CT demonstrates persisting small fluid collection adjacent to the duodenum. Unchanged trace fluid adjacent to the inferior liver. There appears to be small  amount of extraluminal gas adjacent to the surgical suture line. This is slightly increased in volume from the comparison, performed 12 07/2015. IMPRESSION: Status post CT-guided aspiration of small fluid collection within the abdomen. Approximately 2 cc of purulent material was aspirated and sent for culture. The scout CT image demonstrates small amount of gas within the mesenteric adjacent to the surgical suture line, which appears increased in volume from the comparison CT. No increased fluid collection. These results were called by telephone at the time of interpretation on 04/26/2016 at 2:45 pm to surgical team PA, Ms Bailey MechLiz Timi Reeser, who verbally acknowledged these results. Signed, Yvone NeuJaime S. Loreta AveWagner, DO [facility] Electronically Signed   By: Gilmer MorJaime  Wagner D.O.   On: 04/26/2016 14:45    Anti-infectives: Anti-infectives    Start     Dose/Rate Route Frequency Ordered Stop   04/24/16 1800  piperacillin-tazobactam (ZOSYN) IVPB 3.375 g     3.375 g 12.5 mL/hr over 240 Minutes Intravenous Every 8 hours 04/24/16 1716     04/14/16 1730  clindamycin (CLEOCIN) 900 mg, gentamicin (GARAMYCIN) 240 mg in sodium chloride 0.9 % 1,000 mL for intraperitoneal lavage  Status:  Discontinued      Intraperitoneal To Surgery 04/14/16 1719 04/14/16 2103   04/14/16 1545  piperacillin-tazobactam (ZOSYN) IVPB 3.375 g     3.375 g 12.5 mL/hr over 240 Minutes Intravenous Every 8 hours 04/14/16 1533 04/19/16 1159   04/14/16 1530  piperacillin-tazobactam (ZOSYN) IVPB 3.375 g     3.375 g 12.5 mL/hr over 240 Minutes Intravenous  Once 04/14/16 1529 04/14/16 1946   04/14/16 1515  Ampicillin-Sulbactam (UNASYN) 3 g in sodium chloride 0.9 % 100 mL IVPB  Status:  Discontinued     3 g 200 mL/hr over 30 Minutes Intravenous  Once 04/14/16 1514 04/14/16 1529   04/14/16 1515  Ampicillin-Sulbactam (UNASYN) 3 g in sodium chloride 0.9 % 100 mL IVPB  Status:  Discontinued     3 g 200  mL/hr over 30 Minutes Intravenous  Once 04/14/16 1514 04/14/16 1529     Assessment/Plan gangrenous appendicitis with perforation S/P Lap-assisted ileocolonic resection, omentopexy of ileocolonic anastomosis, meckel's diverticulectomy 04/14/16             by Dr. Karie SodaSteven Gross             POD#13             WBC trending down - 18.7 from 22.6              2.5 cm right retroperitoneal fluid collection s/p aspiration by IR 12/5 - Cx pending; do not submerge for 7 days            ileus resolving, still with distention, tolerating diet - will advance today  FEN: full liquids ID: Zosyn VTE: Lovenox, SCD's  Dispo: advance diet, abdominal exams, ambulate Continue dressing changes AM labs    LOS: 13 days    Adam PhenixElizabeth S Paolina Karwowski , [facility] 04/27/2016, 9:38 AM Pager: [phone] Consults: [phone] Mon-Fri 7:00 am-4:30 pm Sat-Sun 7:00 am-11:30 am

## 2016-04-27 NOTE — Progress Notes (Signed)
Nutrition Follow-up  INTERVENTION:   -TPN per Pharmacy - If consumes nutritional supplements today, recommend weaning TPN. -Continue Glucerna Shake po TID, each supplement provides 220 kcal and 10 grams of protein -Encouraged PO intake -RD to continue to monitor for plan  NUTRITION DIAGNOSIS:   Inadequate oral intake related to altered GI function as evidenced by meal completion < 25%, per patient/family report.  Ongoing.  GOAL:   Patient will meet greater than or equal to 90% of their needs  Meeting with TPN.  MONITOR:   PO intake, Supplement acceptance, Labs, Weight trends, Skin, I & O's  ASSESSMENT:   S/p Lap-assisted ileocolonic resection, Omentopexy of ileocolonic anastomosis, Meckel's diverticulectomy 11/23. Now with SBO and abd distension that worsens after eating  Patient in room with nursing student at bedside. Pt reports no issues with TPN. States he has been drinking Glucerna shakes every once in a while during the past 3 days. A supplement was at the bedside, RD encouraged pt to drink all supplements ordered today. At that time, pt was on a clear liquid diet. Noted in surgery note, pt to advance diet today. Increased Glucerna shake order to TID, if consumes all supplements will provide 660 kcal and 30g protein (33% of kcal and 32% of protein needs). Will continue to assess PO intake.   Medications: Lasix tablet BID Labs reviewed: CBGs: 152-157  Diet Order:  Diet - low sodium heart healthy .TPN (CLINIMIX-E) Adult Diet clear liquid Room service appropriate? Yes; Fluid consistency: Thin TPN (CLINIMIX-E) Adult  Skin:  Wound (see comment) (abd incision )  Last BM:  12/6  Height:   Ht Readings from Last 1 Encounters:  04/14/16 5\' 11"  (1.803 m)    Weight:   Wt Readings from Last 1 Encounters:  04/26/16 211 lb 6.4 oz (95.9 kg)    Ideal Body Weight:  78.2 kg  BMI:  Body mass index is 29.48 kg/m.  Estimated Nutritional Needs:   Kcal:  2000-2200kcal/day    Protein:  95-105g  Fluid:  >2L/day   EDUCATION NEEDS:   No education needs identified at this time  Tilda FrancoLindsey Koray Soter, MS, RD, LDN Pager: [phone] After Hours Pager: [phone]

## 2016-04-28 ENCOUNTER — Inpatient Hospital Stay (HOSPITAL_COMMUNITY): Payer: Medicaid Other

## 2016-04-28 LAB — COMPREHENSIVE METABOLIC PANEL
ALT: 35 U/L (ref 17–63)
AST: 24 U/L (ref 15–41)
Albumin: 3 g/dL — ABNORMAL LOW (ref 3.5–5.0)
Alkaline Phosphatase: 124 U/L (ref 38–126)
Anion gap: 7 (ref 5–15)
BILIRUBIN TOTAL: 0.6 mg/dL (ref 0.3–1.2)
BUN: 22 mg/dL — AB (ref 6–20)
CHLORIDE: 97 mmol/L — AB (ref 101–111)
CO2: 27 mmol/L (ref 22–32)
Calcium: 8.4 mg/dL — ABNORMAL LOW (ref 8.9–10.3)
Creatinine, Ser: 0.99 mg/dL (ref 0.61–1.24)
Glucose, Bld: 155 mg/dL — ABNORMAL HIGH (ref 65–99)
POTASSIUM: 4.2 mmol/L (ref 3.5–5.1)
Sodium: 131 mmol/L — ABNORMAL LOW (ref 135–145)
TOTAL PROTEIN: 7.4 g/dL (ref 6.5–8.1)

## 2016-04-28 LAB — GLUCOSE, CAPILLARY
GLUCOSE-CAPILLARY: 148 mg/dL — AB (ref 65–99)
GLUCOSE-CAPILLARY: 159 mg/dL — AB (ref 65–99)

## 2016-04-28 LAB — CBC
HCT: 37.1 % — ABNORMAL LOW (ref 39.0–52.0)
HEMOGLOBIN: 12 g/dL — AB (ref 13.0–17.0)
MCH: 30.8 pg (ref 26.0–34.0)
MCHC: 32.3 g/dL (ref 30.0–36.0)
MCV: 95.1 fL (ref 78.0–100.0)
PLATELETS: 804 10*3/uL — AB (ref 150–400)
RBC: 3.9 MIL/uL — AB (ref 4.22–5.81)
RDW: 13.2 % (ref 11.5–15.5)
WBC: 16.2 10*3/uL — ABNORMAL HIGH (ref 4.0–10.5)

## 2016-04-28 LAB — MAGNESIUM: MAGNESIUM: 2.3 mg/dL (ref 1.7–2.4)

## 2016-04-28 LAB — PHOSPHORUS: PHOSPHORUS: 3.6 mg/dL (ref 2.5–4.6)

## 2016-04-28 MED ORDER — PREMIER PROTEIN SHAKE
11.0000 [oz_av] | Freq: Two times a day (BID) | ORAL | Status: DC
  Administered 2016-04-28: 11 [oz_av] via ORAL

## 2016-04-28 NOTE — Progress Notes (Signed)
PHARMACY - ADULT TOTAL PARENTERAL NUTRITION CONSULT NOTE   Pharmacy Consult for TPN Indication: bowel obstruction  Patient Measurements: Height: 5\' 11"  (180.3 cm) Weight: 211 lb 6.4 oz (95.9 kg) IBW/kg (Calculated) : 75.3   Body mass index is 29.48 kg/m. Usual Weight:  Adjusted weight 84 kg  Insulin Requirements/ 24 hrs: 8 units (on sensitive SSI)  Current Nutrition: CLD  IVF: NS 20 ml/hr  Central access: PICC line ordered 11/30 TPN start date: 11/30  ASSESSMENT                                                                                                          HPI: [id number] yoM presented to ED on 11/23 with abdominal pain and CT showing appendicitis with focal perforation.  Surgery consulted and found to have gangrenous appendicitis with perforation, focal peritonitis, and Meckel's Diverticulum.  S/p Lap-assisted ileocolonic resection, Omentopexy of ileocolonic anastomosis, Meckel's diverticulectomyon 11/23.  Pharmacy is now consulted to dose TPN for suspected bowel obstruction.  Significant events:  12/1 Dr. Johna SheriffHoxworth reports that patient is tolerating small amounts of liquids, but will likely need TPN nutrition support for several days. NGT d/ced. Adv to full liquids 12/2: limited oral intake 12/3: CT shows 2.5 cm abscess right retroperitoneum, WBC elevated at 20k, Zosyn restarted. 12/4: Back to NPO status as may need aspiration of abscess. 12/5: IR aspiration of right retroperitoneal fluid collection 12/7: Advanced to soft diet today, having bowel function, ileus resolving, ok to wean TPN per surgery  Today: (last labs 12/5)  Glucose: CBGs near goal of <150; A1c = 6.1  Electrolytes:  Na slightly low at 131; all other lytes are WNL.   Renal:  SCr wnl (CrCl > 100 ml/min)  LFTs: WNL  TGs: 169 (12/1), 177 (12/5)  Prealbumin: 12.1 (12/1), 10.7 (12/5)  NUTRITIONAL GOALS                                                                                             RD recs  (revised 12/4): 2000-2200 kcal/day, 95-105 g protein/day, > 2L fluid/day  Clinimix 5/15 at a goal rate of 83 ml/hr + 20% fat emulsion at 10 ml/hr to provide: 100 g/day protein, 1894 Kcal/day. This meets 100% of minimum protein and 95% of minimum kCal needs  Clinimix E 5/20 2L and E 5/15 1L are currently unavailable d/t Sport and exercise psychologistnational shortage. Clinimix E 5/20 will be used to begin therapy (1L bag), then transition to Clinimix E 5/15.    PLAN  Per surgery, ok to wean TPN -Wean TPN to half rate (40 ml/hr) x 2 hours, then turn off -D/C SSI -D/C TPN labs -Let IV team know to take down TPN tonight  Haynes Hoehnolleen Jordanny Waddington, PharmD, BCPS 04/28/2016, 12:02 PM  Pager: [phone]

## 2016-04-28 NOTE — Progress Notes (Signed)
[facility] Progress Note  14 Days Post-Op  Subjective: Denies abdominal pain but endorses abdominal fullness. Distention is worse following meals. Deep inspiration is difficult secondary to abdominal distention but taking a breath does not increase pain. Tolerating full liquid diet without nausea, vomiting, or belching. Having 1-2 bowel movements daily. Denies melena/hematochezia. Urinating without hesitancy. Ambulating.  Objective: Vital signs in last 24 hours: Temp:  [98 F (36.7 C)-98.5 F (36.9 C)] 98 F (36.7 C) (12/07 0525) Pulse Rate:  [96-104] 104 (12/07 0525) Resp:  [16] 16 (12/07 0525) BP: (114-126)/(83-85) 126/83 (12/07 0525) SpO2:  [97 %-98 %] 97 % (12/07 0525) Last BM Date: 04/27/16  Intake/Output from previous day: 12/06 0701 - 12/07 0700 In: 3808.9 [P.O.:720; I.V.:2938.9; IV Piggyback:150] Out: 1354 [Urine:1350; Stool:4] Intake/Output this shift: No intake/output data recorded.  PE: Gen:  Alert, NAD, pleasant Card:  RRR, no M/G/R Pulm:  Mildly labored, CTA, no W/R/R Abd: Soft, NT, moderate distention, +BS, midline incision c/d/i Ext:  No erythema, edema, or tenderness   Lab Results:   Recent Labs  04/26/16 0853 04/28/16 0425  WBC 18.7* 16.2*  HGB 12.6* 12.0*  HCT 38.2* 37.1*  PLT 759* 804*   BMET  Recent Labs  04/26/16 0446 04/28/16 0425  NA 131* 131*  K 4.2 4.2  CL 95* 97*  CO2 28 27  GLUCOSE 152* 155*  BUN 21* 22*  CREATININE 0.98 0.99  CALCIUM 8.5* 8.4*   PT/INR No results for input(s): LABPROT, INR in the last 72 hours. CMP     Component Value Date/Time   NA 131 (L) 04/28/2016 0425   K 4.2 04/28/2016 0425   CL 97 (L) 04/28/2016 0425   CO2 27 04/28/2016 0425   GLUCOSE 155 (H) 04/28/2016 0425   BUN 22 (H) 04/28/2016 0425   CREATININE 0.99 04/28/2016 0425   CALCIUM 8.4 (L) 04/28/2016 0425   PROT 7.4 04/28/2016 0425   ALBUMIN 3.0 (L) 04/28/2016 0425   AST 24 04/28/2016 0425   ALT 35 04/28/2016 0425   ALKPHOS 124  04/28/2016 0425   BILITOT 0.6 04/28/2016 0425   GFRNONAA >60 04/28/2016 0425   GFRAA >60 04/28/2016 0425   Lipase     Component Value Date/Time   LIPASE 18 04/14/2016 1105   Studies/Results: Ct Aspiration  Result Date: 04/26/2016 INDICATION: 45 year old male status post appendectomy in with ileus. Potential small abscess contributing to the ileus. He has been referred for aspiration. EXAM: CT GUIDANCE NEEDLE PLACEMENT MEDICATIONS: The patient is currently admitted to the hospital and receiving intravenous antibiotics. The antibiotics were administered within an appropriate time frame prior to the initiation of the procedure. ANESTHESIA/SEDATION: Fentanyl 3.0 mcg IV; Versed 50 mg IV Moderate Sedation Time:  10 The patient was continuously monitored during the procedure by the interventional radiology nurse under my direct supervision. COMPLICATIONS: None PROCEDURE: Informed written consent was obtained from the patient after a thorough discussion of the procedural risks, benefits and alternatives. All questions were addressed. Maximal Sterile Barrier Technique was utilized including caps, mask, sterile gowns, sterile gloves, sterile drape, hand hygiene and skin antiseptic. A timeout was performed prior to the initiation of the procedure. Patient is positioned left decubitus and a scout CT of the abdomen was performed for planning purposes. Once the patient is prepped and draped in the usual sterile fashion, 1% lidocaine was used for local anesthesia. Using CT guidance, a 20 cm 18 gauge trocar needle was advanced into small fluid collection adjacent to the duodenum. Stylet was removed  and approximately 2 cc of purulent material was aspirated. Needle was removed and a final image was stored. Patient tolerated the procedure well and remained hemodynamically stable throughout. No complications were encountered and no significant blood loss encountered. FINDINGS: CT demonstrates persisting small fluid  collection adjacent to the duodenum. Unchanged trace fluid adjacent to the inferior liver. There appears to be small amount of extraluminal gas adjacent to the surgical suture line. This is slightly increased in volume from the comparison, performed 12 07/2015. IMPRESSION: Status post CT-guided aspiration of small fluid collection within the abdomen. Approximately 2 cc of purulent material was aspirated and sent for culture. The scout CT image demonstrates small amount of gas within the mesenteric adjacent to the surgical suture line, which appears increased in volume from the comparison CT. No increased fluid collection. These results were called by telephone at the time of interpretation on 04/26/2016 at 2:45 pm to surgical team PA, Ms Bailey MechLiz Simaan, who verbally acknowledged these results. Signed, Yvone NeuJaime S. Loreta AveWagner, DO [facility] Electronically Signed   By: Gilmer MorJaime  Wagner D.O.   On: 04/26/2016 14:45    Anti-infectives: Anti-infectives    Start     Dose/Rate Route Frequency Ordered Stop   04/24/16 1800  piperacillin-tazobactam (ZOSYN) IVPB 3.375 g     3.375 g 12.5 mL/hr over 240 Minutes Intravenous Every 8 hours 04/24/16 1716     04/14/16 1730  clindamycin (CLEOCIN) 900 mg, gentamicin (GARAMYCIN) 240 mg in sodium chloride 0.9 % 1,000 mL for intraperitoneal lavage  Status:  Discontinued      Intraperitoneal To Surgery 04/14/16 1719 04/14/16 2103   04/14/16 1545  piperacillin-tazobactam (ZOSYN) IVPB 3.375 g     3.375 g 12.5 mL/hr over 240 Minutes Intravenous Every 8 hours 04/14/16 1533 04/19/16 1159   04/14/16 1530  piperacillin-tazobactam (ZOSYN) IVPB 3.375 g     3.375 g 12.5 mL/hr over 240 Minutes Intravenous  Once 04/14/16 1529 04/14/16 1946   04/14/16 1515  Ampicillin-Sulbactam (UNASYN) 3 g in sodium chloride 0.9 % 100 mL IVPB  Status:  Discontinued     3 g 200 mL/hr over 30 Minutes Intravenous  Once 04/14/16 1514 04/14/16 1529   04/14/16  1515  Ampicillin-Sulbactam (UNASYN) 3 g in sodium chloride 0.9 % 100 mL IVPB  Status:  Discontinued     3 g 200 mL/hr over 30 Minutes Intravenous  Once 04/14/16 1514 04/14/16 1529     Assessment/Plan gangrenous appendicitis with perforation S/P Lap-assisted ileocolonic resection, omentopexy of ileocolonic anastomosis, meckel's diverticulectomy 04/14/16 by Dr. Karie SodaSteven Gross POD#15 WBC trending down - 16.2 from 18.7 2.5 cm right retroperitoneal fluid collection s/p aspiration by IR 12/5 - Cx few E.coli; follow sensitivities  ileus resolving, having bowel function, tolerating diet - will advance to SOFT today   Abdominal and chest films to further evaluate distention/labored breathing and r/o post-op free air  D/c TPN  FEN: soft ID: Zosyn VTE: Lovenox, SCD's  Plan: DG chest/abd; advance diet; d/c TPN ambulate Continue zosyn and narrow abx once sensitivities return.   LOS: 14 days    Adam PhenixElizabeth S Simaan , [facility] 04/28/2016, 10:31 AM Pager: [phone] Consults: [phone] Mon-Fri 7:00 am-4:30 pm Sat-Sun 7:00 am-11:30 am

## 2016-04-28 NOTE — Progress Notes (Signed)
Nutrition Follow-up  INTERVENTION:   D/c TPN today per surgery  -Continue Glucerna Shake po TID, each supplement provides 220 kcal and 10 grams of protein -Provide Premier Protein BID, each provides 160 kcal and 30 g protein. -Encouraged PO intake -RD to continue to monitor  NUTRITION DIAGNOSIS:   Inadequate oral intake related to altered GI function as evidenced by meal completion < 25%, per patient/family report.  Ongoing.  GOAL:   Patient will meet greater than or equal to 90% of their needs  Meeting.  MONITOR:   PO intake, Supplement acceptance, Labs, Weight trends, Skin, I & O's  ASSESSMENT:   S/p Lap-assisted ileocolonic resection, Omentopexy of ileocolonic anastomosis, Meckel's diverticulectomy 11/23. Now with SBO and abd distension that worsens after eating  Patient reports drinking 2 Glucerna shake supplements yesterday (440 kcal, 20g protein). He has one at bedside at this time. Pt ate cream soup last night and some other items he cannot remember at this time. Pt consumed grits, yogurt and milk this morning for breakfast and is tolerating well. States he feels ready for solid foods. Pt would prefer a chocolate supplement but states Glucerna only comes in vanilla. Pt is willing to try chocolate Premier Protein, will order.  RD spoke with surgical PA regarding pt's intake, will d/c TPN today and diet will advance to soft.  Will continue to monitor PO intakes.  Medications: Lasix tablet BID Labs reviewed: CBGs: 148-159 Low Na Mg/Phos WNL  Diet Order:  Diet - low sodium heart healthy DIET SOFT Room service appropriate? Yes; Fluid consistency: Thin  Skin:  Wound (see comment) (abd incision )  Last BM:  12/6  Height:   Ht Readings from Last 1 Encounters:  04/14/16 5\' 11"  (1.803 m)    Weight:   Wt Readings from Last 1 Encounters:  04/26/16 211 lb 6.4 oz (95.9 kg)    Ideal Body Weight:  78.2 kg  BMI:  Body mass index is 29.48 kg/m.  Estimated  Nutritional Needs:   Kcal:  2000-2200kcal/day   Protein:  95-105g  Fluid:  >2L/day   EDUCATION NEEDS:   No education needs identified at this time  Aaron FrancoLindsey Callee Rohrig, MS, RD, LDN Pager: [phone] After Hours Pager: [phone]

## 2016-04-29 LAB — BASIC METABOLIC PANEL
ANION GAP: 9 (ref 5–15)
BUN: 24 mg/dL — ABNORMAL HIGH (ref 6–20)
CHLORIDE: 95 mmol/L — AB (ref 101–111)
CO2: 29 mmol/L (ref 22–32)
Calcium: 8.7 mg/dL — ABNORMAL LOW (ref 8.9–10.3)
Creatinine, Ser: 1.05 mg/dL (ref 0.61–1.24)
GFR calc non Af Amer: >60 mL/min (ref >60)
Glucose, Bld: 123 mg/dL — ABNORMAL HIGH (ref 65–99)
POTASSIUM: 4.1 mmol/L (ref 3.5–5.1)
SODIUM: 133 mmol/L — AB (ref 135–145)

## 2016-04-29 LAB — CBC
HEMATOCRIT: 36.4 % — AB (ref 39.0–52.0)
HEMOGLOBIN: 12 g/dL — AB (ref 13.0–17.0)
MCH: 30.8 pg (ref 26.0–34.0)
MCHC: 33 g/dL (ref 30.0–36.0)
MCV: 93.6 fL (ref 78.0–100.0)
Platelets: 774 10*3/uL — ABNORMAL HIGH (ref 150–400)
RBC: 3.89 MIL/uL — AB (ref 4.22–5.81)
RDW: 12.9 % (ref 11.5–15.5)
WBC: 17.7 10*3/uL — AB (ref 4.0–10.5)

## 2016-04-29 MED ORDER — POLYETHYLENE GLYCOL 3350 17 G PO PACK: 17.0000 g | PACK | Freq: Every day | ORAL | Status: DC | PRN

## 2016-04-29 MED ORDER — DEXTROSE 5 % IV SOLN
2.0000 g | INTRAVENOUS | Status: DC
  Administered 2016-04-29 – 2016-04-30 (×2): 2 g via INTRAVENOUS
  Filled 2016-04-29 (×3): qty 2

## 2016-04-29 MED ORDER — DOCUSATE SODIUM 100 MG PO CAPS: 100.0000 mg | ORAL_CAPSULE | Freq: Two times a day (BID) | ORAL | Status: DC | PRN

## 2016-04-29 NOTE — Progress Notes (Signed)
[facility] Progress Note  15 Days Post-Op  Subjective: Increased belching and gas pains last night after initiating soft diet. +flatus. Last BM yesterday afternoon. Ambulating. Denies fever/chills.   Objective: Vital signs in last 24 hours: Temp:  [98.1 F (36.7 C)-98.5 F (36.9 C)] 98.1 F (36.7 C) (12/08 0630) Pulse Rate:  [95-104] 97 (12/08 0630) Resp:  [16-17] 17 (12/08 0630) BP: (107-122)/(79-85) 122/79 (12/08 0630) SpO2:  [97 %-98 %] 97 % (12/08 0630) Last BM Date: 04/28/16  Intake/Output from previous day: 12/07 0701 - 12/08 0700 In: 540 [P.O.:140; I.V.:300; IV Piggyback:100] Out: 450 [Urine:450] Intake/Output this shift: Total I/O In: 240 [P.O.:240] Out: 450 [Urine:450]  PE: Gen:  Alert, NAD, pleasant Abd: Soft, non-tender, mild distention significantly improved compared to exam yesterday, +BS, incisions C/D/I Ext:  No erythema, edema, or tenderness  Lab Results:   Recent Labs  04/28/16 0425 04/29/16 0459  WBC 16.2* 17.7*  HGB 12.0* 12.0*  HCT 37.1* 36.4*  PLT 804* 774*   BMET  Recent Labs  04/28/16 0425 04/29/16 0459  NA 131* 133*  K 4.2 4.1  CL 97* 95*  CO2 27 29  GLUCOSE 155* 123*  BUN 22* 24*  CREATININE 0.99 1.05  CALCIUM 8.4* 8.7*   PT/INR No results for input(s): LABPROT, INR in the last 72 hours. CMP     Component Value Date/Time   NA 133 (L) 04/29/2016 0459   K 4.1 04/29/2016 0459   CL 95 (L) 04/29/2016 0459   CO2 29 04/29/2016 0459   GLUCOSE 123 (H) 04/29/2016 0459   BUN 24 (H) 04/29/2016 0459   CREATININE 1.05 04/29/2016 0459   CALCIUM 8.7 (L) 04/29/2016 0459   PROT 7.4 04/28/2016 0425   ALBUMIN 3.0 (L) 04/28/2016 0425   AST 24 04/28/2016 0425   ALT 35 04/28/2016 0425   ALKPHOS 124 04/28/2016 0425   BILITOT 0.6 04/28/2016 0425   GFRNONAA >60 04/29/2016 0459   GFRAA >60 04/29/2016 0459   Lipase     Component Value Date/Time   LIPASE 18 04/14/2016 1105   Studies/Results: Dg Abd 1 View  Result Date:  04/28/2016 CLINICAL DATA:  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 Patient is complaining of significant abdominal pain with the distention. EXAM: ABDOMEN - 1 VIEW COMPARISON:  CT scan of the abdomen dated April 26, 2016 and abdominal series of April 23, 2016. FINDINGS: There is increased volume of small and large bowel gas since the previous study. There is a small amount of gas in the rectum. No free extraluminal gas collections are observed. No abnormal soft tissue calcifications are demonstrated. Tubular structures overlie the right mid abdomen but may be external to the patient. The bony structures exhibit no acute abnormality. IMPRESSION: Interval increase in the volume of gas present within distended small and large bowel loops consistent with ileus. No definite free extraluminal gas collections. Electronically Signed   By: David  SwazilandJordan M.D.   On: 04/28/2016 11:43   Dg Chest Port 1 View  Result Date: 04/28/2016 CLINICAL DATA:  Post- operative from ileal Colcrys section with metals diverticulectomy on November 23rd. Patient is experiencing shortness of breath likely related to significant abdominal distension. Patient is a former smoker. EXAM: PORTABLE CHEST 1 VIEW COMPARISON:  None in PACs FINDINGS: The lung volumes are decreased. There is minimal increased density at the right lung base. The heart is normal in size. The pulmonary vascularity is normal. The PICC line tip projects at the  cavoatrial junction. IMPRESSION: Bilateral hypo inflation. Minimal atelectasis and possible trace pleural effusion at the right lung base. No large pleural effusion. No evidence of CHF. Electronically Signed   By: David  [place]azil[place] M.D.   On: 04/28/2016 11:45    Anti-infectives: Anti-infectives    Start     Dose/Rate Route Frequency Ordered Stop   04/24/16 1800  piperacillin-tazobactam (ZOSYN) IVPB 3.375 g     3.375 g 12.5 mL/hr over 240 Minutes Intravenous  Every 8 hours 04/24/16 1716     04/14/16 1730  clindamycin (CLEOCIN) 900 mg, gentamicin (GARAMYCIN) 240 mg in sodium chloride 0.9 % 1,000 mL for intraperitoneal lavage  Status:  Discontinued      Intraperitoneal To Surgery 04/14/16 1719 04/14/16 2103   04/14/16 1545  piperacillin-tazobactam (ZOSYN) IVPB 3.375 g     3.375 g 12.5 mL/hr over 240 Minutes Intravenous Every 8 hours 04/14/16 1533 04/19/16 1159   04/14/16 1530  piperacillin-tazobactam (ZOSYN) IVPB 3.375 g     3.375 g 12.5 mL/hr over 240 Minutes Intravenous  Once 04/14/16 1529 04/14/16 1946   04/14/16 1515  Ampicillin-Sulbactam (UNASYN) 3 g in sodium chloride 0.9 % 100 mL IVPB  Status:  Discontinued     3 g 200 mL/hr over 30 Minutes Intravenous  Once 04/14/16 1514 04/14/16 1529   04/14/16 1515  Ampicillin-Sulbactam (UNASYN) 3 g in sodium chloride 0.9 % 100 mL IVPB  Status:  Discontinued     3 g 200 mL/hr over 30 Minutes Intravenous  Once 04/14/16 1514 04/14/16 1529     Assessment/Plan gangrenous appendicitis with perforation S/P Lap-assisted ileocolonic resection, omentopexy of ileocolonic anastomosis, meckel's diverticulectomy 04/14/16 by Dr. Karie SodaSteven Gross POD#16 WBC trending down - 17.7 from 16.2 2.5 cm right retroperitoneal fluid collection s/p aspiration by IR 12/5 - Cx few E.coli  ileus resolving, having bowel function, tolerating diet; TPN d/c-ed 12/7             Abdominal and chest films 12/7 significant for ileus, no intra-abdominal free air; CXR negative for PNA              FEN: soft ID: Zosyn VTE: Lovenox, SCD's  Plan: low suspicion for worsening of inta-abdominal infection, suspect slow resolving ileus. Back off diet to clear liquids and add PRN stool softeners/miralax. Repeat CBC and abdominal film in AM and narrow abx to Rocephin or cipro.   LOS: 15 days    Adam PhenixElizabeth S Jaileen Janelle , [facility] 04/29/2016, 12:40 PM Pager:  [phone] Consults: [phone] Mon-Fri 7:00 am-4:30 pm Sat-Sun 7:00 am-11:30 am

## 2016-04-29 NOTE — Progress Notes (Signed)
Followed up with patient to see if [facility]. He declines at this time, states he or his wife will be able to manage the wound care. Discussed with nurse, teaching will be done.

## 2016-04-30 ENCOUNTER — Inpatient Hospital Stay (HOSPITAL_COMMUNITY): Payer: Medicaid Other

## 2016-04-30 LAB — CBC
HCT: 35.9 % — ABNORMAL LOW (ref 39.0–52.0)
HEMOGLOBIN: 11.8 g/dL — AB (ref 13.0–17.0)
MCH: 30.9 pg (ref 26.0–34.0)
MCHC: 32.9 g/dL (ref 30.0–36.0)
MCV: 94 fL (ref 78.0–100.0)
PLATELETS: 658 10*3/uL — AB (ref 150–400)
RBC: 3.82 MIL/uL — AB (ref 4.22–5.81)
RDW: 12.8 % (ref 11.5–15.5)
WBC: 12.9 10*3/uL — AB (ref 4.0–10.5)

## 2016-04-30 MED ORDER — SODIUM CHLORIDE 0.9% FLUSH: 3.0000 mL | Freq: Two times a day (BID) | INTRAVENOUS | Status: DC

## 2016-04-30 MED ORDER — SODIUM CHLORIDE 0.9% FLUSH: 3.0000 mL | INTRAVENOUS | Status: DC | PRN

## 2016-04-30 NOTE — Progress Notes (Signed)
[facility] Progress Note  16 Days Post-Op  Subjective: Having bowel function.  Feels good  Objective: Vital signs in last 24 hours: Temp:  [98 F (36.7 C)-98.3 F (36.8 C)] 98.3 F (36.8 C) (12/09 0616) Pulse Rate:  [82-92] 90 (12/09 0616) Resp:  [16] 16 (12/09 0616) BP: (111-115)/(53-77) 111/77 (12/09 0616) SpO2:  [98 %-100 %] 100 % (12/09 0616) Last BM Date: 04/28/16  Intake/Output from previous day: 12/08 0701 - 12/09 0700 In: 970 [P.O.:720; I.V.:200; IV Piggyback:50] Out: 1650 [Urine:1650] Intake/Output this shift: Total I/O In: 360 [P.O.:360] Out: -   PE: Gen:  Alert, NAD, pleasant Abd: Soft, non-tender incisions C/D/I Ext:  No erythema, edema, or tenderness  Lab Results:   Recent Labs  04/29/16 0459 04/30/16 0334  WBC 17.7* 12.9*  HGB 12.0* 11.8*  HCT 36.4* 35.9*  PLT 774* 658*   BMET  Recent Labs  04/28/16 0425 04/29/16 0459  NA 131* 133*  K 4.2 4.1  CL 97* 95*  CO2 27 29  GLUCOSE 155* 123*  BUN 22* 24*  CREATININE 0.99 1.05  CALCIUM 8.4* 8.7*   PT/INR No results for input(s): LABPROT, INR in the last 72 hours. CMP     Component Value Date/Time   NA 133 (L) 04/29/2016 0459   K 4.1 04/29/2016 0459   CL 95 (L) 04/29/2016 0459   CO2 29 04/29/2016 0459   GLUCOSE 123 (H) 04/29/2016 0459   BUN 24 (H) 04/29/2016 0459   CREATININE 1.05 04/29/2016 0459   CALCIUM 8.7 (L) 04/29/2016 0459   PROT 7.4 04/28/2016 0425   ALBUMIN 3.0 (L) 04/28/2016 0425   AST 24 04/28/2016 0425   ALT 35 04/28/2016 0425   ALKPHOS 124 04/28/2016 0425   BILITOT 0.6 04/28/2016 0425   GFRNONAA >60 04/29/2016 0459   GFRAA >60 04/29/2016 0459   Lipase     Component Value Date/Time   LIPASE 18 04/14/2016 1105   Studies/Results: Dg Abd 1 View  Result Date: 04/30/2016 CLINICAL DATA:  Patient with sharp abdominal pain. EXAM: ABDOMEN - 1 VIEW COMPARISON:  Abdominal radiograph 04/28/2016. FINDINGS: Slight interval improvement in gaseous distended loops  of small bowel throughout the abdomen. Supine evaluation limited for the detection of free intraperitoneal air. Unremarkable osseous skeleton. IMPRESSION: Slight interval improvement in gaseous distended loops of small bowel throughout the abdomen most compatible with improving ileus. Electronically Signed   By: Annia Beltrew  Davis M.D.   On: 04/30/2016 08:16   Dg Chest Port 1 View  Result Date: 04/28/2016 CLINICAL DATA:  Post- operative from ileal Colcrys section with metals diverticulectomy on November 23rd. Patient is experiencing shortness of breath likely related to significant abdominal distension. Patient is a former smoker. EXAM: PORTABLE CHEST 1 VIEW COMPARISON:  None in PACs FINDINGS: The lung volumes are decreased. There is minimal increased density at the right lung base. The heart is normal in size. The pulmonary vascularity is normal. The PICC line tip projects at the cavoatrial junction. IMPRESSION: Bilateral hypo inflation. Minimal atelectasis and possible trace pleural effusion at the right lung base. No large pleural effusion. No evidence of CHF. Electronically Signed   By: David  [place]Jordan M.D.   On: 04/28/2016 11:45    Anti-infectives: Anti-infectives    Start     Dose/Rate Route Frequency Ordered Stop   04/29/16 1400  cefTRIAXone (ROCEPHIN) 2 g in dextrose 5 % 50 mL IVPB    Comments:  Pharmacy may adjust dosing strength / duration / interval for maximal efficacy  2 g 100 mL/hr over 30 Minutes Intravenous Every 24 hours 04/29/16 1316     04/24/16 1800  piperacillin-tazobactam (ZOSYN) IVPB 3.375 g  Status:  Discontinued     3.375 g 12.5 mL/hr over 240 Minutes Intravenous Every 8 hours 04/24/16 1716 04/29/16 1316   04/14/16 1730  clindamycin (CLEOCIN) 900 mg, gentamicin (GARAMYCIN) 240 mg in sodium chloride 0.9 % 1,000 mL for intraperitoneal lavage  Status:  Discontinued      Intraperitoneal To Surgery 04/14/16 1719 04/14/16 2103   04/14/16 1545  piperacillin-tazobactam (ZOSYN) IVPB 3.375  g     3.375 g 12.5 mL/hr over 240 Minutes Intravenous Every 8 hours 04/14/16 1533 04/19/16 1159   04/14/16 1530  piperacillin-tazobactam (ZOSYN) IVPB 3.375 g     3.375 g 12.5 mL/hr over 240 Minutes Intravenous  Once 04/14/16 1529 04/14/16 1946   04/14/16 1515  Ampicillin-Sulbactam (UNASYN) 3 g in sodium chloride 0.9 % 100 mL IVPB  Status:  Discontinued     3 g 200 mL/hr over 30 Minutes Intravenous  Once 04/14/16 1514 04/14/16 1529   04/14/16 1515  Ampicillin-Sulbactam (UNASYN) 3 g in sodium chloride 0.9 % 100 mL IVPB  Status:  Discontinued     3 g 200 mL/hr over 30 Minutes Intravenous  Once 04/14/16 1514 04/14/16 1529     Assessment/Plan gangrenous appendicitis with perforation S/P Lap-assisted ileocolonic resection, omentopexy of ileocolonic anastomosis, meckel's diverticulectomy 04/14/16 by Dr. Karie SodaSteven Gross POD#17 WBC trending down  2.5 cm right retroperitoneal fluid collection s/p aspiration by IR 12/5 - Cx few E.coli  ileus resolving, having bowel function, tolerating diet; TPN d/c-ed 12/7             Abdominal and chest films 12/7 significant for ileus, no intra-abdominal free air; CXR negative for PNA.  AXR shows improvement              FEN: soft diet today ID: Zosyn VTE: Lovenox, SCD's  Plan: Advance to soft diet.  If WBC still down in AM will switch to PO abx and possibly d/c home   LOS: 16 days    Vanita PandaAlicia C Allee Busk, MD  Colorectal and General Surgery [facility]

## 2016-05-01 LAB — CBC
HEMATOCRIT: 36.3 % — AB (ref 39.0–52.0)
HEMOGLOBIN: 12.1 g/dL — AB (ref 13.0–17.0)
MCH: 31.1 pg (ref 26.0–34.0)
MCHC: 33.3 g/dL (ref 30.0–36.0)
MCV: 93.3 fL (ref 78.0–100.0)
Platelets: 641 10*3/uL — ABNORMAL HIGH (ref 150–400)
RBC: 3.89 MIL/uL — ABNORMAL LOW (ref 4.22–5.81)
RDW: 12.8 % (ref 11.5–15.5)
WBC: 14.2 10*3/uL — AB (ref 4.0–10.5)

## 2016-05-01 LAB — AEROBIC/ANAEROBIC CULTURE (SURGICAL/DEEP WOUND)

## 2016-05-01 LAB — AEROBIC/ANAEROBIC CULTURE W GRAM STAIN (SURGICAL/DEEP WOUND)

## 2016-05-01 MED ORDER — CIPROFLOXACIN HCL 500 MG PO TABS
500.0000 mg | ORAL_TABLET | Freq: Two times a day (BID) | ORAL | Status: DC
  Administered 2016-05-01 – 2016-05-03 (×4): 500 mg via ORAL
  Filled 2016-05-01 (×4): qty 1

## 2016-05-01 NOTE — Progress Notes (Signed)
[facility] Progress Note  17 Days Post-Op  Subjective: Having bowel function.  C/o left sided back pain that is relieved with BM  Objective: Vital signs in last 24 hours: Temp:  [98.1 F (36.7 C)-99.6 F (37.6 C)] 98.1 F (36.7 C) (12/10 0537) Pulse Rate:  [88-111] 97 (12/10 0537) Resp:  [16-18] 18 (12/10 0537) BP: (115-122)/(74-78) 115/78 (12/10 0537) SpO2:  [97 %-100 %] 97 % (12/10 0537) Last BM Date: 04/30/16  Intake/Output from previous day: 12/09 0701 - 12/10 0700 In: 1190 [P.O.:1140; IV Piggyback:50] Out: 400 [Urine:400] Intake/Output this shift: Total I/O In: 360 [P.O.:360] Out: -   PE: Gen:  Alert, NAD, pleasant Abd: Soft, non-tender incisions C/D/I Ext:  No erythema, edema, or tenderness  Lab Results:   Recent Labs  04/29/16 0459 04/30/16 0334  WBC 17.7* 12.9*  HGB 12.0* 11.8*  HCT 36.4* 35.9*  PLT 774* 658*   BMET  Recent Labs  04/29/16 0459  NA 133*  K 4.1  CL 95*  CO2 29  GLUCOSE 123*  BUN 24*  CREATININE 1.05  CALCIUM 8.7*   PT/INR No results for input(s): LABPROT, INR in the last 72 hours. CMP     Component Value Date/Time   NA 133 (L) 04/29/2016 0459   K 4.1 04/29/2016 0459   CL 95 (L) 04/29/2016 0459   CO2 29 04/29/2016 0459   GLUCOSE 123 (H) 04/29/2016 0459   BUN 24 (H) 04/29/2016 0459   CREATININE 1.05 04/29/2016 0459   CALCIUM 8.7 (L) 04/29/2016 0459   PROT 7.4 04/28/2016 0425   ALBUMIN 3.0 (L) 04/28/2016 0425   AST 24 04/28/2016 0425   ALT 35 04/28/2016 0425   ALKPHOS 124 04/28/2016 0425   BILITOT 0.6 04/28/2016 0425   GFRNONAA >60 04/29/2016 0459   GFRAA >60 04/29/2016 0459   Lipase     Component Value Date/Time   LIPASE 18 04/14/2016 1105   Studies/Results: Dg Abd 1 View  Result Date: 04/30/2016 CLINICAL DATA:  Patient with sharp abdominal pain. EXAM: ABDOMEN - 1 VIEW COMPARISON:  Abdominal radiograph 04/28/2016. FINDINGS: Slight interval improvement in gaseous distended loops of small bowel  throughout the abdomen. Supine evaluation limited for the detection of free intraperitoneal air. Unremarkable osseous skeleton. IMPRESSION: Slight interval improvement in gaseous distended loops of small bowel throughout the abdomen most compatible with improving ileus. Electronically Signed   By: Annia Beltrew  Davis M.D.   On: 04/30/2016 08:16    Anti-infectives: Anti-infectives    Start     Dose/Rate Route Frequency Ordered Stop   04/29/16 1400  cefTRIAXone (ROCEPHIN) 2 g in dextrose 5 % 50 mL IVPB    Comments:  Pharmacy may adjust dosing strength / duration / interval for maximal efficacy   2 g 100 mL/hr over 30 Minutes Intravenous Every 24 hours 04/29/16 1316     04/24/16 1800  piperacillin-tazobactam (ZOSYN) IVPB 3.375 g  Status:  Discontinued     3.375 g 12.5 mL/hr over 240 Minutes Intravenous Every 8 hours 04/24/16 1716 04/29/16 1316   04/14/16 1730  clindamycin (CLEOCIN) 900 mg, gentamicin (GARAMYCIN) 240 mg in sodium chloride 0.9 % 1,000 mL for intraperitoneal lavage  Status:  Discontinued      Intraperitoneal To Surgery 04/14/16 1719 04/14/16 2103   04/14/16 1545  piperacillin-tazobactam (ZOSYN) IVPB 3.375 g     3.375 g 12.5 mL/hr over 240 Minutes Intravenous Every 8 hours 04/14/16 1533 04/19/16 1159   04/14/16 1530  piperacillin-tazobactam (ZOSYN) IVPB 3.375 g  3.375 g 12.5 mL/hr over 240 Minutes Intravenous  Once 04/14/16 1529 04/14/16 1946   04/14/16 1515  Ampicillin-Sulbactam (UNASYN) 3 g in sodium chloride 0.9 % 100 mL IVPB  Status:  Discontinued     3 g 200 mL/hr over 30 Minutes Intravenous  Once 04/14/16 1514 04/14/16 1529   04/14/16 1515  Ampicillin-Sulbactam (UNASYN) 3 g in sodium chloride 0.9 % 100 mL IVPB  Status:  Discontinued     3 g 200 mL/hr over 30 Minutes Intravenous  Once 04/14/16 1514 04/14/16 1529     Assessment/Plan gangrenous appendicitis with perforation S/P Lap-assisted ileocolonic resection, omentopexy of ileocolonic anastomosis, meckel's diverticulectomy  04/14/16 by Dr. Karie SodaSteven Gross POD#18 WBC trending down  2.5 cm right retroperitoneal fluid collection s/p aspiration by IR 12/5 - Cx few E.coli  ileus resolving, having bowel function, tolerating diet; TPN d/c-ed 12/7             Abdominal and chest films 12/7 significant for ileus, no intra-abdominal free air; CXR negative for PNA.  AXR shows improvement              FEN: cont soft diet today ID: Zosyn, will switch to PO abx if wbc still trending down today VTE: Lovenox, SCD's  Plan: Cont soft diet.  If WBC still down today, will switch to PO abx Will hold off on d/c since pt having some flank pain, suspect it is related to bowel distention   LOS: 17 days    Vanita PandaAlicia C Diandra Cimini, MD  Colorectal and General Surgery [facility]

## 2016-05-02 ENCOUNTER — Inpatient Hospital Stay (HOSPITAL_COMMUNITY): Payer: Medicaid Other

## 2016-05-02 LAB — CBC
HEMATOCRIT: 34.8 % — AB (ref 39.0–52.0)
HEMOGLOBIN: 11.9 g/dL — AB (ref 13.0–17.0)
MCH: 30.8 pg (ref 26.0–34.0)
MCHC: 34.2 g/dL (ref 30.0–36.0)
MCV: 90.2 fL (ref 78.0–100.0)
Platelets: 569 10*3/uL — ABNORMAL HIGH (ref 150–400)
RBC: 3.86 MIL/uL — ABNORMAL LOW (ref 4.22–5.81)
RDW: 12.4 % (ref 11.5–15.5)
WBC: 15.1 10*3/uL — ABNORMAL HIGH (ref 4.0–10.5)

## 2016-05-02 LAB — DIFFERENTIAL
BASOS ABS: 0.1 10*3/uL (ref 0.0–0.1)
BASOS PCT: 0 %
EOS ABS: 0.2 10*3/uL (ref 0.0–0.7)
Eosinophils Relative: 1 %
Lymphocytes Relative: 19 %
Lymphs Abs: 2.8 10*3/uL (ref 0.7–4.0)
Monocytes Absolute: 1.6 10*3/uL — ABNORMAL HIGH (ref 0.1–1.0)
Monocytes Relative: 10 %
NEUTROS ABS: 10.5 10*3/uL — AB (ref 1.7–7.7)
NEUTROS PCT: 70 %

## 2016-05-02 MED ORDER — IOPAMIDOL (ISOVUE-300) INJECTION 61%
INTRAVENOUS | Status: AC
  Administered 2016-05-02: 15 mL
  Filled 2016-05-02: qty 30

## 2016-05-02 MED ORDER — IOPAMIDOL (ISOVUE-300) INJECTION 61%
100.0000 mL | Freq: Once | INTRAVENOUS | Status: AC | PRN
  Administered 2016-05-02: 100 mL via INTRAVENOUS

## 2016-05-02 NOTE — Progress Notes (Signed)
Patient ID: Aaron Trujillo, male   DOB: 08-23-1970, 45 y.o.   MRN: [id number]  [facility]  18 Days Post-Op  Subjective: Feeling a little better today. Reports left sided flank pain that began about 2 days ago; at first it was constant and stabbing in nature, today it is less severe although does still bother him more with deep inspiration.  Denies dysuria, hematuria, or urinary frequency, denies cough.  Formed BM yesterday. Tolerating diet.  Objective: Vital signs in last 24 hours: Temp:  [98.3 F (36.8 C)-98.9 F (37.2 C)] 98.7 F (37.1 C) (12/11 0619) Pulse Rate:  [100-111] 100 (12/11 0619) Resp:  [16-18] 18 (12/11 0619) BP: (117-120)/(72-77) 117/72 (12/11 0619) SpO2:  [94 %-97 %] 94 % (12/11 0619) Last BM Date: 04/30/16  Intake/Output from previous day: 12/10 0701 - 12/11 0700 In: 1200 [P.O.:1200] Out: -  Intake/Output this shift: Total I/O In: 360 [P.O.:360] Out: -   PE: Gen:  Alert, NAD, pleasant Abd: Soft, non-tender, mild distention, +BS, open midline incision pink with no drainage and healing well. Left flank tenderness Ext:  No erythema, edema, or tenderness  Lab Results:   Recent Labs  05/01/16 1035 05/02/16 0341  WBC 14.2* 15.1*  HGB 12.1* 11.9*  HCT 36.3* 34.8*  PLT 641* 569*   BMET No results for input(s): NA, K, CL, CO2, GLUCOSE, BUN, CREATININE, CALCIUM in the last 72 hours. PT/INR No results for input(s): LABPROT, INR in the last 72 hours. CMP     Component Value Date/Time   NA 133 (L) 04/29/2016 0459   K 4.1 04/29/2016 0459   CL 95 (L) 04/29/2016 0459   CO2 29 04/29/2016 0459   GLUCOSE 123 (H) 04/29/2016 0459   BUN 24 (H) 04/29/2016 0459   CREATININE 1.05 04/29/2016 0459   CALCIUM 8.7 (L) 04/29/2016 0459   PROT 7.4 04/28/2016 0425   ALBUMIN 3.0 (L) 04/28/2016 0425   AST 24 04/28/2016 0425   ALT 35 04/28/2016 0425   ALKPHOS 124 04/28/2016 0425   BILITOT 0.6 04/28/2016 0425   GFRNONAA >60 04/29/2016 0459    GFRAA >60 04/29/2016 0459   Lipase     Component Value Date/Time   LIPASE 18 04/14/2016 1105       Studies/Results: No results found.  Anti-infectives: Anti-infectives    Start     Dose/Rate Route Frequency Ordered Stop   05/01/16 1600  ciprofloxacin (CIPRO) tablet 500 mg     500 mg Oral 2 times daily 05/01/16 1320     04/29/16 1400  cefTRIAXone (ROCEPHIN) 2 g in dextrose 5 % 50 mL IVPB  Status:  Discontinued    Comments:  Pharmacy may adjust dosing strength / duration / interval for maximal efficacy   2 g 100 mL/hr over 30 Minutes Intravenous Every 24 hours 04/29/16 1316 05/01/16 1320   04/24/16 1800  piperacillin-tazobactam (ZOSYN) IVPB 3.375 g  Status:  Discontinued     3.375 g 12.5 mL/hr over 240 Minutes Intravenous Every 8 hours 04/24/16 1716 04/29/16 1316   04/14/16 1730  clindamycin (CLEOCIN) 900 mg, gentamicin (GARAMYCIN) 240 mg in sodium chloride 0.9 % 1,000 mL for intraperitoneal lavage  Status:  Discontinued      Intraperitoneal To Surgery 04/14/16 1719 04/14/16 2103   04/14/16 1545  piperacillin-tazobactam (ZOSYN) IVPB 3.375 g     3.375 g 12.5 mL/hr over 240 Minutes Intravenous Every 8 hours 04/14/16 1533 04/19/16 1159   04/14/16 1530  piperacillin-tazobactam (ZOSYN) IVPB 3.375 g  3.375 g 12.5 mL/hr over 240 Minutes Intravenous  Once 04/14/16 1529 04/14/16 1946   04/14/16 1515  Ampicillin-Sulbactam (UNASYN) 3 g in sodium chloride 0.9 % 100 mL IVPB  Status:  Discontinued     3 g 200 mL/hr over 30 Minutes Intravenous  Once 04/14/16 1514 04/14/16 1529   04/14/16 1515  Ampicillin-Sulbactam (UNASYN) 3 g in sodium chloride 0.9 % 100 mL IVPB  Status:  Discontinued     3 g 200 mL/hr over 30 Minutes Intravenous  Once 04/14/16 1514 04/14/16 1529       Assessment/Plan gangrenous appendicitis with perforation S/P Lap-assisted ileocolonic resection, omentopexy of ileocolonic anastomosis, meckel's diverticulectomy 04/14/16 by Dr. Karie SodaSteven  Gross POD#18 2.5 cm right retroperitoneal fluid collection s/p aspiration by IR 12/5 - Cx few E.coli             WBC going back up, 18.8 today  FEN: soft ID: Zosyn stopped 12/3, cipro 12/10>> VTE: Lovenox, SCD's  Plan: WBC up to 18.8 today, afebrile. Left sided flank pain for about 2 days. Recommend CT scan today.   LOS: 18 days    Edson SnowballBROOKE A MILLER , [facility] 05/02/2016, 9:47 AM Pager: [phone] Consults: [phone] Mon-Fri 7:00 am-4:30 pm Sat-Sun 7:00 am-11:30 am

## 2016-05-02 NOTE — Progress Notes (Signed)
Nutrition Follow-up  INTERVENTION:   D/c Premier Protein per patient request Continue Glucerna Shake po TID, each supplement provides 220 kcal and 10 grams of protein Encourage PO intake RD to continue to monitor for needs  NUTRITION DIAGNOSIS:   Inadequate oral intake related to altered GI function as evidenced by meal completion < 25%, per patient/family report.  Improving.  GOAL:   Patient will meet greater than or equal to 90% of their needs  Progressing.  MONITOR:   PO intake, Supplement acceptance, Labs, Weight trends, Skin, I & O's  ASSESSMENT:   S/p Lap-assisted ileocolonic resection, Omentopexy of ileocolonic anastomosis, Meckel's diverticulectomy 11/23. Now with SBO and abd distension that worsens after eating  Patient reports eating much better. States his appetite has improved and he had a BM this morning. Pt ate some eggs and grits for breakfast. Over the weekend he had macaroni and cheese, mashed potatoes and a hamburger patty. Pt states he is tolerating his diet well. Pt states he has not drank any supplements d/t his improved intake. Will leave order in the system in case pt wants a supplement. Will d/c Premier Protein order per pt request.  Labs reviewed. Medications: Lasix tablet BID  Diet Order:  Diet - low sodium heart healthy DIET SOFT Room service appropriate? Yes; Fluid consistency: Thin  Skin:  Wound (see comment) (abd incision )  Last BM:  12/9  Height:   Ht Readings from Last 1 Encounters:  04/14/16 5\' 11"  (1.803 m)    Weight:   Wt Readings from Last 1 Encounters:  04/26/16 211 lb 6.4 oz (95.9 kg)    Ideal Body Weight:  78.2 kg  BMI:  Body mass index is 29.48 kg/m.  Estimated Nutritional Needs:   Kcal:  2000-2200kcal/day   Protein:  95-105g  Fluid:  >2L/day   EDUCATION NEEDS:   No education needs identified at this time  Tilda FrancoLindsey Teodor Prater, MS, RD, LDN Pager: [phone] After Hours Pager: [phone]

## 2016-05-03 LAB — COMPREHENSIVE METABOLIC PANEL
ALK PHOS: 173 U/L — AB (ref 38–126)
ALT: 64 U/L — AB (ref 17–63)
AST: 21 U/L (ref 15–41)
Albumin: 2.7 g/dL — ABNORMAL LOW (ref 3.5–5.0)
Anion gap: 9 (ref 5–15)
BILIRUBIN TOTAL: 1 mg/dL (ref 0.3–1.2)
BUN: 17 mg/dL (ref 6–20)
CALCIUM: 8.6 mg/dL — AB (ref 8.9–10.3)
CO2: 27 mmol/L (ref 22–32)
CREATININE: 0.9 mg/dL (ref 0.61–1.24)
Chloride: 94 mmol/L — ABNORMAL LOW (ref 101–111)
Glucose, Bld: 124 mg/dL — ABNORMAL HIGH (ref 65–99)
Potassium: 3.9 mmol/L (ref 3.5–5.1)
Sodium: 130 mmol/L — ABNORMAL LOW (ref 135–145)
Total Protein: 7.4 g/dL (ref 6.5–8.1)

## 2016-05-03 LAB — CBC
HEMATOCRIT: 35 % — AB (ref 39.0–52.0)
HEMOGLOBIN: 11.7 g/dL — AB (ref 13.0–17.0)
MCH: 30.6 pg (ref 26.0–34.0)
MCHC: 33.4 g/dL (ref 30.0–36.0)
MCV: 91.6 fL (ref 78.0–100.0)
Platelets: 523 10*3/uL — ABNORMAL HIGH (ref 150–400)
RBC: 3.82 MIL/uL — AB (ref 4.22–5.81)
RDW: 12.6 % (ref 11.5–15.5)
WBC: 14.7 10*3/uL — ABNORMAL HIGH (ref 4.0–10.5)

## 2016-05-03 MED ORDER — METRONIDAZOLE 500 MG PO TABS: 500.0000 mg | ORAL_TABLET | Freq: Three times a day (TID) | ORAL | 0 refills | Status: AC

## 2016-05-03 MED ORDER — CIPROFLOXACIN HCL 500 MG PO TABS: 750.0000 mg | ORAL_TABLET | Freq: Two times a day (BID) | ORAL | 0 refills | Status: DC

## 2016-05-03 NOTE — Discharge Summary (Signed)
[facility] Discharge Summary   Patient ID: Aaron Trujillo MRN: [id number] DOB/AGE: May 09, 1971 45 y.o.  Admit date: 04/14/2016 Discharge date: 05/03/2016  Admitting Diagnosis: Acute appendicitis with peritonitis Leukocytosis  Discharge Diagnosis Patient Active Problem List   Diagnosis Date Noted  . Pre-diabetes 04/16/2016  . Acute gangrenous appendicitis with perforation and peritonitis s/p ileocolectomy 04/14/2016 04/14/2016  . Hyperglycemia 04/14/2016  . Meckels diverticulum s/p diverticulectomy 04/14/2016 04/14/2016    Consultants Maple HudsonKaren Sanders RN, WOC Gilmer MorJaime Wagner MD, interventional radiology  Imaging: Ct Abdomen Pelvis W Contrast  Result Date: 05/02/2016 CLINICAL DATA:  Status post ileocolonic resection, status post Meckel's diverticulum resection, post appendectomy, left flank pain, leukocytosis, possible abscess EXAM: CT ABDOMEN AND PELVIS WITH CONTRAST TECHNIQUE: Multidetector CT imaging of the abdomen and pelvis was performed using the standard protocol following bolus administration of intravenous contrast. CONTRAST:  100mL ISOVUE-300 IOPAMIDOL (ISOVUE-300) INJECTION 61% COMPARISON:  04/24/2016 FINDINGS: Lower chest: Bilateral small pleural effusion with bilateral lower lobe posterior atelectasis or infiltrate. Hepatobiliary: Enhanced liver shows no focal mass. Mild distended gallbladder. Mild thickening of gallbladder wall and stranding of pericholecystic fat. Early cholecystitis cannot be excluded. Clinical correlation is necessary. Further correlation with gallbladder ultrasound is recommended. No calcified gallstones are noted within gallbladder. Pancreas: Enhanced pancreas has normal appearance. Spleen: Enhanced spleen is normal. Adrenals/Urinary Tract: No adrenal gland mass enhanced kidneys are symmetrical in size. No hydronephrosis or hydroureter. Stomach/Bowel: Mild distended distal small bowel loops probable mild ileus. Again noted small mesenteric  fluid collection just anterior to duodenum measures 3 cm without definite evidence of air-fluid level suggests un- mesenteric abscess. Small amount of fluid in right paracolic gutter again noted. In axial image 43 there is a tubular gaseous fistulous tract just right lateral to colonic stump in mid abdomen. A lobulated air collection in the right anterior mesentery measures 2.3 cm. Additional small bubbles of extraluminal air are noted in axial image 33. Findings are consistent with air leak/perforation along the suture line please see axial image 35. This is confirmed in sagittal image 70. No definite extraluminal contrast material is noted. Mild thickened wall small bowel loops in right lower quadrant probable postsurgical edema or mild enteritis. Vascular/Lymphatic: No aortic aneurysm. No significant mesenteric or retroperitoneal adenopathy. Reproductive: Prostate gland and seminal vesicles are unremarkable. No pelvic mass. Other: No inguinal adenopathy. No free fluid is noted within pelvis. There is mild thickening of urinary bladder wall. Mild satellite cystitis cannot be excluded. Musculoskeletal: No destructive bony lesions are noted. Sagittal images of the spine shows mild degenerative changes thoracolumbar spine. IMPRESSION: 1. In axial image 43 there is a tubular gaseous fistulous tract just right lateral to colonic stump in mid abdomen. A lobulated air collection in the right anterior mesentery measures 2.3 cm. Additional small bubbles of extraluminal air are noted in axial image 33. Findings are consistent with air leak/perforation along the suture line please see axial image 35. This is confirmed in sagittal image 70. No definite extraluminal contrast material is noted. 2. Stable fluid in right paracolic gutter. Stable mesenteric fluid in just anterior to distal duodenum without definite evidence of air-fluid level to suggest an abscess. 3. There is mild thickening and mild enhancement of gallbladder  wall. Early cholecystitis cannot be excluded. Mild distension of the gallbladder. 4. Mild thickening of the wall of distal small bowel loops in right lower quadrant axial image 68. Postop edema or mild enteritis cannot be excluded. These results were called by telephone at the time of interpretation on  05/02/2016 at 3:27 pm to PA. Evangeline GulaBROOKE MILLER , who verbally acknowledged these results. Electronically Signed   By: Natasha MeadLiviu  Pop M.D.   On: 05/02/2016 15:28    Procedures Dr. Michaell CowingGross (04/14/16) - Lap-assisted ileocolonic resection, omentopexy of ileocolonic anastomosis, meckel's diverticulectomy   Hospital Course:  Aaron BioShannon W Serviss is a 45yo male who [facility] with one day of worsening abdominal pain that localized to the RLQ.  Workup showed appendicitis with focal perforation.  Patient was admitted, started on IVF, IV antibiotics, and underwent procedure listed above.  Tolerated procedure well and was transferred to the floor.  Initially given 5 days of IV antibiotics postoperatively given moderate amount of peritonitis. He had an ileus postoperatively as expected, but due to persistent abdominal distension an abdominal xray was ordered and showed a significant SBO. On POD7 he was started on TPN due to inability to tolerate substantial amount of diet PO. SBO improved and diet again slowly advanced. 12/3 WBC elevated to 20K and CT scan showed 2.5 cm right retroperitoneal fluid collection; IR was consulted and aspirated the fluid collection; initially started back on zosyn but culture showed few E. coli therefore he was transitioned to cipro. WBC initially improved but on 12/11 it rose to 18.8K; repeat CT scan showed transient anastomotic air leak, no contrast leakage; continued with conservative management. On POD19 the patient was voiding well, tolerating soft diet, ambulating well, pain well controlled, vital signs stable, WBC trending down, abdominal wound c/d/i and felt stable for discharge home.   He will be on cipro and flagyl PO for 3 weeks and resume a low residue diet. He will continue twice daily wet to dry dressing changes to his abdominal wound. He will follow-up with Dr. Michaell CowingGross next week with a CBC and abdominal CT prior to that appointment.      Medication List    TAKE these medications   ciprofloxacin 500 MG tablet Commonly known as:  CIPRO Take 1.5 tablets (750 mg total) by mouth 2 (two) times daily.   metroNIDAZOLE 500 MG tablet Commonly known as:  FLAGYL Take 1 tablet (500 mg total) by mouth 3 (three) times daily.   MUCINEX D MAX STRENGTH [id number] MG Tb12 Generic drug:  Pseudoephedrine-Guaifenesin Take 1 tablet by mouth 2 (two) times daily as needed (for cough/congestion).   naproxen 500 MG tablet Commonly known as:  NAPROSYN Take 1 tablet (500 mg total) by mouth every 12 (twelve) hours as needed for mild pain or moderate pain.   oxyCODONE 5 MG immediate release tablet Commonly known as:  Oxy IR/ROXICODONE Take 1-2 tablets (5-10 mg total) by mouth every 4 (four) hours as needed for moderate pain, severe pain or breakthrough pain.   simethicone 80 MG chewable tablet Commonly known as:  MYLICON Chew 160 mg by mouth every 6 (six) hours as needed for flatulence.        Follow-up Information    GROSS,STEVEN C., MD. Schedule an appointment as soon as possible for a visit in 2 weeks.   Specialty:  General Surgery Why:  To follow up after your operation, To follow up after your hospital stay Contact information: [address] [place] [address] [phone]           Signed: Edson SnowballBROOKE A MILLER, [facility] 05/03/2016, 2:49 PM Pager: [phone] Consults: [phone] Mon-Fri 7:00 am-4:30 pm Sat-Sun 7:00 am-11:30 am

## 2016-05-03 NOTE — Progress Notes (Signed)
Pt's vitals are WNL, tolerating diet and pain is under control. Discussed discharged instructions with both patient and wife. Discharged to home with prescriptions.

## 2016-05-03 NOTE — Discharge Instructions (Addendum)
[facility]      [facility], [place] [phone]  COLON SURGERY: POST OP INSTRUCTIONS  Always review your discharge instruction sheet given to you by the facility where your surgery was performed.  IF YOU HAVE DISABILITY OR FAMILY LEAVE FORMS, YOU MUST BRING THEM TO THE OFFICE FOR PROCESSING.  PLEASE DO NOT GIVE THEM TO YOUR DOCTOR.  1. A prescription for pain medication may be given to you upon discharge.  Take your pain medication as prescribed, if needed.  If narcotic pain medicine is not needed, then you may take acetaminophen (Tylenol) or ibuprofen (Advil) as needed. 2. Take your usually prescribed medications unless otherwise directed. 3. If you need a refill on your pain medication, please contact your pharmacy. They will contact our office to request authorization.  Prescriptions will not be filled after 5pm or on week-ends. 4. You should follow a soft, low fiber diet.  Do not overeat.  Be sure to include lots of fluids daily. Most patients will experience some swelling and bruising in the area of the incision. Ice pack will help. Swelling and bruising can take several days to resolve..  5. It is common to experience some constipation if taking pain medication after surgery.  Increasing fluid intake and taking a stool softener will usually help or prevent this problem from occurring.  A mild laxative (Milk of Magnesia or Miralax) should be taken according to package directions if there are no bowel movements after 48 hours. 6.  You may have steri-strips (small skin tapes) in place directly over the incision.  These strips should be left on the skin.  If your surgeon used skin glue on the incision, you may shower in 24 hours.  The glue will flake off over the next 2-3 weeks.  Any sutures or staples will be removed at the office during your follow-up visit. You may find that a light gauze bandage over your incision may keep your staples from being rubbed or pulled. You may shower and replace  the bandage daily. 7. ACTIVITIES:  You may resume regular (light) daily activities beginning the next day--such as daily self-care, walking, climbing stairs--gradually increasing activities as tolerated.  You may have sexual intercourse when it is comfortable.  Refrain from any heavy lifting or straining until release to do so by MD.  Do not lift anything over 10 pounds.  a. You may drive when you no longer are taking prescription pain medication, you can comfortably wear a seatbelt, and you can safely maneuver your car and apply brakes b. Return to Work: _When released to do so by doctor.__________________________________ 8. You should see your doctor in the office for a follow-up appointment approximately 2-3 weeks after your surgery.  Make sure that you call for this appointment within a day or two after you arrive home to insure a convenient appointment time. OTHER INSTRUCTIONS:  __Dressing change:  Saline wet to dry daily.___________________________________________________________ _____________________________________________________________  WHEN TO CALL YOUR DOCTOR: 1. Fever over 101.0 2. Inability to urinate 3. Nausea and/or vomiting 4. Extreme swelling or bruising 5. Increased pain, redness, or drainage from the wound.  The clinic staff is available to answer your questions during regular business hours.  Please dont hesitate to call and ask to speak to one of the nurses if you have concerns.  For further questions, please visit [email or web address]

## 2016-05-03 NOTE — Progress Notes (Signed)
Assessment Principal Problem:   Acute gangrenous appendicitis with perforation and peritonitis s/p ileocolectomy 04/14/2016-WBC down some; CT findings noted; this appears to be a transient anastomotic air leak, no contrast leakage and his is slowly improving. Dr. Michaell CowingGross and I discussed his situation.  Patient would like to go home if possible. Active Problems:   Hyperglycemia   Meckels diverticulum s/p diverticulectomy 04/14/2016   Pre-diabetes   Plan:  Discharge home on Cipro and Flagyl.  Low residue diet.  Follow up with Dr. Michaell CowingGross next week with CT of abdomen and pelvis and CBC.  Instructions given to him.   LOS: 19 days     19 Days Post-Op  Subjective: No abdominal pain.  Tolerating diet.  Bowels moving.  We discussed CT findings.  Objective: Vital signs in last 24 hours: Temp:  [98.9 F (37.2 C)-99.1 F (37.3 C)] 99.1 F (37.3 C) (12/12 0558) Pulse Rate:  [100-104] 104 (12/12 0558) Resp:  [16-18] 18 (12/12 0558) BP: (106-115)/(73-74) 106/73 (12/12 0558) SpO2:  [95 %-97 %] 96 % (12/12 0558) Last BM Date: 05/02/16  Intake/Output from previous day: 12/11 0701 - 12/12 0700 In: 1922 [P.O.:1902; I.V.:20] Out: 0  Intake/Output this shift: No intake/output data recorded.  PE: General- In NAD Abdomen-soft, open wound clean  Lab Results:   Recent Labs  05/02/16 0341 05/03/16 0441  WBC 15.1* 14.7*  HGB 11.9* 11.7*  HCT 34.8* 35.0*  PLT 569* 523*   BMET  Recent Labs  05/03/16 0441  NA 130*  K 3.9  CL 94*  CO2 27  GLUCOSE 124*  BUN 17  CREATININE 0.90  CALCIUM 8.6*   PT/INR No results for input(s): LABPROT, INR in the last 72 hours. Comprehensive Metabolic Panel:    Component Value Date/Time   NA 130 (L) 05/03/2016 0441   NA 133 (L) 04/29/2016 0459   K 3.9 05/03/2016 0441   K 4.1 04/29/2016 0459   CL 94 (L) 05/03/2016 0441   CL 95 (L) 04/29/2016 0459   CO2 27 05/03/2016 0441   CO2 29 04/29/2016 0459   BUN 17 05/03/2016 0441   BUN 24 (H) 04/29/2016  0459   CREATININE 0.90 05/03/2016 0441   CREATININE 1.05 04/29/2016 0459   GLUCOSE 124 (H) 05/03/2016 0441   GLUCOSE 123 (H) 04/29/2016 0459   CALCIUM 8.6 (L) 05/03/2016 0441   CALCIUM 8.7 (L) 04/29/2016 0459   AST 21 05/03/2016 0441   AST 24 04/28/2016 0425   ALT 64 (H) 05/03/2016 0441   ALT 35 04/28/2016 0425   ALKPHOS 173 (H) 05/03/2016 0441   ALKPHOS 124 04/28/2016 0425   BILITOT 1.0 05/03/2016 0441   BILITOT 0.6 04/28/2016 0425   PROT 7.4 05/03/2016 0441   PROT 7.4 04/28/2016 0425   ALBUMIN 2.7 (L) 05/03/2016 0441   ALBUMIN 3.0 (L) 04/28/2016 0425     Studies/Results: Ct Abdomen Pelvis W Contrast  Result Date: 05/02/2016 CLINICAL DATA:  Status post ileocolonic resection, status post Meckel's diverticulum resection, post appendectomy, left flank pain, leukocytosis, possible abscess EXAM: CT ABDOMEN AND PELVIS WITH CONTRAST TECHNIQUE: Multidetector CT imaging of the abdomen and pelvis was performed using the standard protocol following bolus administration of intravenous contrast. CONTRAST:  100mL ISOVUE-300 IOPAMIDOL (ISOVUE-300) INJECTION 61% COMPARISON:  04/24/2016 FINDINGS: Lower chest: Bilateral small pleural effusion with bilateral lower lobe posterior atelectasis or infiltrate. Hepatobiliary: Enhanced liver shows no focal mass. Mild distended gallbladder. Mild thickening of gallbladder wall and stranding of pericholecystic fat. Early cholecystitis cannot be excluded. Clinical correlation is  necessary. Further correlation with gallbladder ultrasound is recommended. No calcified gallstones are noted within gallbladder. Pancreas: Enhanced pancreas has normal appearance. Spleen: Enhanced spleen is normal. Adrenals/Urinary Tract: No adrenal gland mass enhanced kidneys are symmetrical in size. No hydronephrosis or hydroureter. Stomach/Bowel: Mild distended distal small bowel loops probable mild ileus. Again noted small mesenteric fluid collection just anterior to duodenum measures 3 cm  without definite evidence of air-fluid level suggests un- mesenteric abscess. Small amount of fluid in right paracolic gutter again noted. In axial image 43 there is a tubular gaseous fistulous tract just right lateral to colonic stump in mid abdomen. A lobulated air collection in the right anterior mesentery measures 2.3 cm. Additional small bubbles of extraluminal air are noted in axial image 33. Findings are consistent with air leak/perforation along the suture line please see axial image 35. This is confirmed in sagittal image 70. No definite extraluminal contrast material is noted. Mild thickened wall small bowel loops in right lower quadrant probable postsurgical edema or mild enteritis. Vascular/Lymphatic: No aortic aneurysm. No significant mesenteric or retroperitoneal adenopathy. Reproductive: Prostate gland and seminal vesicles are unremarkable. No pelvic mass. Other: No inguinal adenopathy. No free fluid is noted within pelvis. There is mild thickening of urinary bladder wall. Mild satellite cystitis cannot be excluded. Musculoskeletal: No destructive bony lesions are noted. Sagittal images of the spine shows mild degenerative changes thoracolumbar spine. IMPRESSION: 1. In axial image 43 there is a tubular gaseous fistulous tract just right lateral to colonic stump in mid abdomen. A lobulated air collection in the right anterior mesentery measures 2.3 cm. Additional small bubbles of extraluminal air are noted in axial image 33. Findings are consistent with air leak/perforation along the suture line please see axial image 35. This is confirmed in sagittal image 70. No definite extraluminal contrast material is noted. 2. Stable fluid in right paracolic gutter. Stable mesenteric fluid in just anterior to distal duodenum without definite evidence of air-fluid level to suggest an abscess. 3. There is mild thickening and mild enhancement of gallbladder wall. Early cholecystitis cannot be excluded. Mild  distension of the gallbladder. 4. Mild thickening of the wall of distal small bowel loops in right lower quadrant axial image 68. Postop edema or mild enteritis cannot be excluded. These results were called by telephone at the time of interpretation on 05/02/2016 at 3:27 pm to PA. Evangeline GulaBROOKE MILLER , who verbally acknowledged these results. Electronically Signed   By: Natasha MeadLiviu  Pop M.D.   On: 05/02/2016 15:28    Anti-infectives: Anti-infectives    Start     Dose/Rate Route Frequency Ordered Stop   05/01/16 1600  ciprofloxacin (CIPRO) tablet 500 mg     500 mg Oral 2 times daily 05/01/16 1320     04/29/16 1400  cefTRIAXone (ROCEPHIN) 2 g in dextrose 5 % 50 mL IVPB  Status:  Discontinued    Comments:  Pharmacy may adjust dosing strength / duration / interval for maximal efficacy   2 g 100 mL/hr over 30 Minutes Intravenous Every 24 hours 04/29/16 1316 05/01/16 1320   04/24/16 1800  piperacillin-tazobactam (ZOSYN) IVPB 3.375 g  Status:  Discontinued     3.375 g 12.5 mL/hr over 240 Minutes Intravenous Every 8 hours 04/24/16 1716 04/29/16 1316   04/14/16 1730  clindamycin (CLEOCIN) 900 mg, gentamicin (GARAMYCIN) 240 mg in sodium chloride 0.9 % 1,000 mL for intraperitoneal lavage  Status:  Discontinued      Intraperitoneal To Surgery 04/14/16 1719 04/14/16 2103   04/14/16 1545  piperacillin-tazobactam (ZOSYN) IVPB 3.375 g     3.375 g 12.5 mL/hr over 240 Minutes Intravenous Every 8 hours 04/14/16 1533 04/19/16 1159   04/14/16 1530  piperacillin-tazobactam (ZOSYN) IVPB 3.375 g     3.375 g 12.5 mL/hr over 240 Minutes Intravenous  Once 04/14/16 1529 04/14/16 1946   04/14/16 1515  Ampicillin-Sulbactam (UNASYN) 3 g in sodium chloride 0.9 % 100 mL IVPB  Status:  Discontinued     3 g 200 mL/hr over 30 Minutes Intravenous  Once 04/14/16 1514 04/14/16 1529   04/14/16 1515  Ampicillin-Sulbactam (UNASYN) 3 g in sodium chloride 0.9 % 100 mL IVPB  Status:  Discontinued     3 g 200 mL/hr over 30 Minutes Intravenous   Once 04/14/16 1514 04/14/16 1529       Danissa Rundle J 05/03/2016

## 2016-05-04 ENCOUNTER — Other Ambulatory Visit (HOSPITAL_COMMUNITY): Payer: Self-pay | Admitting: Surgery

## 2016-05-04 DIAGNOSIS — K651 Peritoneal abscess: Principal | ICD-10-CM

## 2016-05-04 DIAGNOSIS — T814XXA Infection following a procedure, initial encounter: Principal | ICD-10-CM

## 2016-05-04 DIAGNOSIS — IMO0001 Reserved for inherently not codable concepts without codable children: Secondary | ICD-10-CM

## 2016-05-09 ENCOUNTER — Ambulatory Visit (HOSPITAL_COMMUNITY)
Admission: RE | Admit: 2016-05-09 | Discharge: 2016-05-09 | Disposition: A | Discharge status: Home / Self Care | Payer: Medicaid Other | Source: Ambulatory Visit | Attending: Surgery | Admitting: Surgery

## 2016-05-09 ENCOUNTER — Encounter (HOSPITAL_COMMUNITY): Payer: Self-pay

## 2016-05-09 DIAGNOSIS — IMO0001 Reserved for inherently not codable concepts without codable children: Secondary | ICD-10-CM

## 2016-05-09 DIAGNOSIS — K651 Peritoneal abscess: Secondary | ICD-10-CM | POA: Insufficient documentation

## 2016-05-09 DIAGNOSIS — T814XXA Infection following a procedure, initial encounter: Secondary | ICD-10-CM | POA: Insufficient documentation

## 2016-05-09 MED ORDER — IOPAMIDOL (ISOVUE-300) INJECTION 61%
INTRAVENOUS | Status: AC
  Administered 2016-05-09: 100 mL
  Filled 2016-05-09: qty 100

## 2016-05-11 ENCOUNTER — Other Ambulatory Visit (HOSPITAL_COMMUNITY): Payer: Self-pay | Admitting: Surgery

## 2016-05-11 DIAGNOSIS — T8143XA Infection following a procedure, organ and space surgical site, initial encounter: Secondary | ICD-10-CM

## 2016-05-11 DIAGNOSIS — T8149XA Infection following a procedure, other surgical site, initial encounter: Secondary | ICD-10-CM

## 2016-05-11 DIAGNOSIS — K651 Peritoneal abscess: Principal | ICD-10-CM

## 2016-05-11 DIAGNOSIS — T814XXA Infection following a procedure, initial encounter: Principal | ICD-10-CM

## 2016-05-11 DIAGNOSIS — IMO0001 Reserved for inherently not codable concepts without codable children: Secondary | ICD-10-CM

## 2016-05-31 ENCOUNTER — Other Ambulatory Visit (HOSPITAL_COMMUNITY): Payer: Self-pay | Admitting: Surgery

## 2016-05-31 ENCOUNTER — Ambulatory Visit (HOSPITAL_COMMUNITY)
Admission: RE | Admit: 2016-05-31 | Discharge: 2016-05-31 | Disposition: A | Discharge status: Home / Self Care | Payer: Medicaid Other | Source: Ambulatory Visit | Attending: Surgery | Admitting: Surgery

## 2016-05-31 ENCOUNTER — Encounter (HOSPITAL_COMMUNITY): Payer: Self-pay

## 2016-05-31 DIAGNOSIS — X58XXXA Exposure to other specified factors, initial encounter: Secondary | ICD-10-CM | POA: Diagnosis not present

## 2016-05-31 DIAGNOSIS — T8143XA Infection following a procedure, organ and space surgical site, initial encounter: Secondary | ICD-10-CM

## 2016-05-31 DIAGNOSIS — T814XXA Infection following a procedure, initial encounter: Secondary | ICD-10-CM | POA: Diagnosis not present

## 2016-05-31 DIAGNOSIS — Z9889 Other specified postprocedural states: Secondary | ICD-10-CM | POA: Diagnosis not present

## 2016-05-31 DIAGNOSIS — T8149XA Infection following a procedure, other surgical site, initial encounter: Secondary | ICD-10-CM

## 2016-05-31 MED ORDER — IOPAMIDOL (ISOVUE-300) INJECTION 61%
INTRAVENOUS | Status: AC
  Filled 2016-05-31: qty 100

## 2016-05-31 MED ORDER — IOPAMIDOL (ISOVUE-300) INJECTION 61%
INTRAVENOUS | Status: AC
  Administered 2016-05-31: 30 mL via ORAL
  Filled 2016-05-31: qty 30

## 2016-05-31 MED ORDER — IOPAMIDOL (ISOVUE-300) INJECTION 61%
100.0000 mL | Freq: Once | INTRAVENOUS | Status: AC | PRN
  Administered 2016-05-31: 100 mL via INTRAVENOUS

## 2017-04-10 ENCOUNTER — Ambulatory Visit: Payer: Self-pay | Admitting: Surgery

## 2019-03-18 ENCOUNTER — Other Ambulatory Visit: Payer: Self-pay

## 2019-03-18 DIAGNOSIS — Z20822 Contact with and (suspected) exposure to covid-19: Secondary | ICD-10-CM

## 2019-03-19 LAB — NOVEL CORONAVIRUS, NAA: SARS-CoV-2, NAA: NOT DETECTED

## 2021-04-26 ENCOUNTER — Emergency Department (HOSPITAL_BASED_OUTPATIENT_CLINIC_OR_DEPARTMENT_OTHER)
Admission: EM | Admit: 2021-04-26 | Discharge: 2021-04-26 | Disposition: A | Discharge status: Home / Self Care | Payer: Self-pay | Attending: Emergency Medicine | Admitting: Emergency Medicine

## 2021-04-26 ENCOUNTER — Emergency Department (HOSPITAL_BASED_OUTPATIENT_CLINIC_OR_DEPARTMENT_OTHER): Payer: Self-pay | Admitting: Radiology

## 2021-04-26 ENCOUNTER — Encounter (HOSPITAL_BASED_OUTPATIENT_CLINIC_OR_DEPARTMENT_OTHER): Payer: Self-pay

## 2021-04-26 ENCOUNTER — Other Ambulatory Visit: Payer: Self-pay

## 2021-04-26 DIAGNOSIS — M7041 Prepatellar bursitis, right knee: Secondary | ICD-10-CM | POA: Insufficient documentation

## 2021-04-26 DIAGNOSIS — Z87891 Personal history of nicotine dependence: Secondary | ICD-10-CM | POA: Insufficient documentation

## 2021-04-26 DIAGNOSIS — Y9389 Activity, other specified: Secondary | ICD-10-CM | POA: Insufficient documentation

## 2021-04-26 MED ORDER — NAPROXEN 500 MG PO TABS
500.0000 mg | ORAL_TABLET | Freq: Two times a day (BID) | ORAL | 1 refills | Status: AC | PRN
Start: 2021-04-26 — End: ?

## 2021-04-26 NOTE — ED Triage Notes (Signed)
Pt presents with Right knee swelling x6 days. 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 Thursday he noticed his knee split open with a moderate amount of drainage. Improved over the weekend, today while standing at work he noticed tightness and "fluid building up"  Some pain w/ambulating.  Pt denies SOC/CP/Cough.

## 2021-04-26 NOTE — ED Provider Notes (Signed)
[facility] EMERGENCY DEPARTMENT Provider Note  CSN: [id number] Arrival date & time: 04/26/21 1322    History Chief Complaint  Patient presents with   Knee Pain    Aaron Trujillo is a 50 y.o. male with no significant PMH reports he has had several days of swelling in his R knee. 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 He reports he had a small amount of drainage from the knee last week. He denies any fever. Pain is worse with movement.    Past Medical History:  Diagnosis Date   Pre-diabetes 04/16/2016    Past Surgical History:  Procedure Laterality Date   APPENDECTOMY     LAPAROSCOPIC APPENDECTOMY N/A 04/14/2016   Procedure: ileocolonic resection with Meckel's diverticulectomy;  Surgeon: Karie Soda, MD;  Location: [facility];  Service: General;  Laterality: N/A;    History reviewed. No pertinent family history.  Social History   Tobacco Use   Smoking status: Former   Smokeless tobacco: Never  Substance Use Topics   Alcohol use: No     Home Medications Prior to Admission medications   Medication Sig Start Date End Date Taking? Authorizing Provider  naproxen (NAPROSYN) 500 MG tablet Take 1 tablet (500 mg total) by mouth every 12 (twelve) hours as needed for mild pain or moderate pain. 04/26/21   Pollyann Savoy, MD  Pseudoephedrine-Guaifenesin (MUCINEX D MAX STRENGTH) [phone] MG TB12 Take 1 tablet by mouth 2 (two) times daily as needed (for cough/congestion).    [provider]  simethicone (MYLICON) 80 MG chewable tablet Chew 160 mg by mouth every 6 (six) hours as needed for flatulence.    [provider]     Allergies    Patient has no known allergies.   Review of Systems   Review of Systems A comprehensive review of systems was completed and negative except as noted in HPI.     Physical Exam BP 130/85 (BP Location: Right Arm)   Pulse 84   Temp 98.2 F (36.8 C)   Resp 16   SpO2 98%   Physical  Exam Vitals and nursing note reviewed.  HENT:     Head: Normocephalic.     Nose: Nose normal.  Eyes:     Extraocular Movements: Extraocular movements intact.  Pulmonary:     Effort: Pulmonary effort is normal.  Musculoskeletal:        General: Normal range of motion.     Cervical back: Neck supple.     Comments: Pre-patella swelling, no erythema or signs of infection. No joint effusion  Skin:    Findings: No rash (on exposed skin).  Neurological:     Mental Status: He is alert and oriented to person, place, and time.  Psychiatric:        Mood and Affect: Mood normal.     ED Results / Procedures / Treatments   Labs (all labs ordered are listed, but only abnormal results are displayed) Labs Reviewed - No data to display  EKG None   Radiology DG Knee Complete 4 Views Right  Result Date: 04/26/2021 CLINICAL DATA:  Swelling over the last 6 days. EXAM: RIGHT KNEE - COMPLETE 4+ VIEW COMPARISON:  None. FINDINGS: Marked prepatellar soft tissue swelling suggesting prepatellar bursitis. No visible joint effusion. No degenerative joint disease or focal bone finding. IMPRESSION: Marked prepatellar soft tissue swelling consistent with prepatellar bursitis. Electronically Signed   By: Paulina Fusi M.D.   On: 04/26/2021 14:31  Procedures Procedures  Medications Ordered in the ED Medications - No data to display   MDM Rules/Calculators/A&P MDM Xray and exam is consistent with pre-patellar bursitis. No signs of infection or drainage today. Recommend immobilization, NSAIDs and rest. Avoid direct pressure to the knee by kneeling. Ortho follow up.   ED Course  I have reviewed the triage vital signs and the nursing notes.  Pertinent labs & imaging results that were available during my care of the patient were reviewed by me and considered in my medical decision making (see chart for details).     Final Clinical Impression(s) / ED Diagnoses Final diagnoses:  Prepatellar bursitis of  right knee    Rx / DC Orders ED Discharge Orders          Ordered    naproxen (NAPROSYN) 500 MG tablet  Every 12 hours PRN        04/26/21 1626             Pollyann Savoy, MD 04/26/21 1627

## 2021-04-27 ENCOUNTER — Ambulatory Visit (INDEPENDENT_AMBULATORY_CARE_PROVIDER_SITE_OTHER): Payer: Self-pay | Admitting: Orthopaedic Surgery

## 2021-04-27 ENCOUNTER — Other Ambulatory Visit (HOSPITAL_BASED_OUTPATIENT_CLINIC_OR_DEPARTMENT_OTHER): Payer: Self-pay

## 2021-04-27 ENCOUNTER — Other Ambulatory Visit (HOSPITAL_BASED_OUTPATIENT_CLINIC_OR_DEPARTMENT_OTHER): Payer: Self-pay | Admitting: Orthopaedic Surgery

## 2021-04-27 ENCOUNTER — Encounter (INDEPENDENT_AMBULATORY_CARE_PROVIDER_SITE_OTHER): Payer: Self-pay

## 2021-04-27 DIAGNOSIS — I824Y9 Acute embolism and thrombosis of unspecified deep veins of unspecified proximal lower extremity: Secondary | ICD-10-CM

## 2021-04-27 DIAGNOSIS — M7051 Other bursitis of knee, right knee: Secondary | ICD-10-CM

## 2021-04-27 MED ORDER — TRIAMCINOLONE ACETONIDE 40 MG/ML IJ SUSP
80.0000 mg | INTRAMUSCULAR | Status: AC | PRN
  Administered 2021-04-27: 80 mg via INTRA_ARTICULAR

## 2021-04-27 MED ORDER — CEPHALEXIN 500 MG PO CAPS
500.0000 mg | ORAL_CAPSULE | Freq: Four times a day (QID) | ORAL | 0 refills | Status: AC
  Filled 2021-04-27: qty 28, 7d supply, fill #0

## 2021-04-27 MED ORDER — LIDOCAINE HCL 1 % IJ SOLN
4.0000 mL | INTRAMUSCULAR | Status: AC | PRN
Start: 2021-04-27 — End: 2021-04-27
  Administered 2021-04-27: 4 mL

## 2021-04-27 NOTE — Progress Notes (Signed)
Chief Complaint: Right knee swelling     History of Present Illness:     Aaron Trujillo is a 50 y.o. male with right knee bursitis going on now for several days.  This came on spontaneously.  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  The procedure commenced.  But the knee was not aspirated at that time.  He is complaining of swelling about the anterior aspect of the knee.  There is minimal to no pain with range of motion about the knee.  He does state that he was given a brace but this is somewhat redistributed the swelling.  Denies any fevers chills redness or purulent looking drainage.  He does state that the prepatellar area did decompress on its own and started to drain fluid.    Surgical History:   None  PMH/PSH/Family History/Social History/Meds/Allergies:    Past Medical History:  Diagnosis Date  . Pre-diabetes 04/16/2016   Past Surgical History:  Procedure Laterality Date  . APPENDECTOMY    . LAPAROSCOPIC APPENDECTOMY N/A 04/14/2016   Procedure: ileocolonic resection with Meckel's diverticulectomy;  Surgeon: Karie Soda, MD;  Location: [facility];  Service: General;  Laterality: N/A;   Social History   Socioeconomic History  . Marital status: Married    Spouse name: Not on file  . Number of children: Not on file  . Years of education: Not on file  . Highest education level: Not on file  Occupational History  . Not on file  Tobacco Use  . Smoking status: Former  . Smokeless tobacco: Never  Substance and Sexual Activity  . Alcohol use: No  . Drug use: Not on file  . Sexual activity: Not on file  Other Topics Concern  . Not on file  Social History Narrative  . Not on file   Social Determinants of Health   Financial Resource Strain: Not on file  Food Insecurity: Not on file  Transportation Needs: Not on file  Physical Activity: Not on file  Stress: Not on file  Social Connections: Not on  file   No family history on file. No Known Allergies Current Outpatient Medications  Medication Sig Dispense Refill  . naproxen (NAPROSYN) 500 MG tablet Take 1 tablet (500 mg total) by mouth every 12 (twelve) hours as needed for mild pain or moderate pain. 30 tablet 1  . Pseudoephedrine-Guaifenesin (MUCINEX D MAX STRENGTH) [phone] MG TB12 Take 1 tablet by mouth 2 (two) times daily as needed (for cough/congestion).    . simethicone (MYLICON) 80 MG chewable tablet Chew 160 mg by mouth every 6 (six) hours as needed for flatulence.     No current facility-administered medications for this visit.   DG Knee Complete 4 Views Right  Result Date: 04/26/2021 CLINICAL DATA:  Swelling over the last 6 days. EXAM: RIGHT KNEE - COMPLETE 4+ VIEW COMPARISON:  None. FINDINGS: Marked prepatellar soft tissue swelling suggesting prepatellar bursitis. No visible joint effusion. No degenerative joint disease or focal bone finding. IMPRESSION: Marked prepatellar soft tissue swelling consistent with prepatellar bursitis. Electronically Signed   By: Paulina Fusi M.D.   On: 04/26/2021 14:31    Review of Systems:   A ROS was performed including pertinent positives and negatives as documented in the HPI.  Physical  Exam :   Constitutional: NAD and appears stated age Neurological: Alert and oriented Psych: Appropriate affect and cooperative There were no vitals taken for this visit.   Comprehensive Musculoskeletal Exam:      Musculoskeletal Exam  Gait Normal  Alignment Normal   Right Left  Inspection Normal Normal  Palpation    Tenderness Prepatellar None  Crepitus None None  Effusion None None  Range of Motion    Extension 0 0  Flexion 135 135  Strength    Extension 5/5 5/5  Flexion 5/5 5/5  Ligament Exam     Generalized Laxity No No  Lachman Negative Negative   Pivot Shift Negative Negative  Anterior Drawer Negative Negative  Valgus at 0 Negative Negative  Valgus at 20 Negative Negative  Varus  at 0 0 0  Varus at 20   0 0  Posterior Drawer at 90 0 0  Vascular/Lymphatic Exam    Edema None None  Venous Stasis Changes No No  Distal Circulation Normal Normal  Neurologic    Light Touch Sensation Intact Intact  Special Tests: Prepatellar swelling     Imaging:   Xray (4 views right knee): Normal with prepatellar swelling   I personally reviewed and interpreted the radiographs.   Assessment:   50 year old male with right prepatellar bursitis.  This does not appear to be infected at this point.  He does have swelling down the entirety of the right leg which I believe is his bursitis decompressing.  He was advised on active compression about the right knee.  I also aspirated the right knee bursa at today's visit and got out 10 cc of serosanguineous appearing fluid.  I will plan to send him next-door for a DVT ultrasound of the leg to rule this out given the fact that he does have swelling about the entirety of the lower aspect of the leg.  He will follow back up with me in 1 week if there is not complete resolution of this.  He was also given warning signs on evidence of infection to be present with  Plan :    -Right knee aspirated with 10 cc of fluid   Procedure Note  Patient: Aaron Trujillo             Date of Birth: 08/17/1970           MRN: [id number]             Visit Date: 04/27/2021  Procedures: Visit Diagnoses:  1. Other bursitis of knee, right knee     Large Joint Inj on 04/27/2021 2:45 PM Indications: pain Details: 22 G 1.5 in needle, ultrasound-guided anterior approach  Arthrogram: No  Medications: 4 mL lidocaine 1 %; 80 mg triamcinolone acetonide 40 MG/ML Outcome: tolerated well, no immediate complications Procedure, treatment alternatives, risks and benefits explained, specific risks discussed. Consent was given by the patient. Immediately prior to procedure a time out was called to verify the correct patient, procedure, equipment, support staff and  site/side marked as required. Patient was prepped and draped in the usual sterile fashion.         I personally saw and evaluated the patient, and participated in the management and treatment plan.  Huel Cote, MD Attending Physician, Orthopedic Surgery  This document was dictated using Dragon voice recognition software. A reasonable attempt at proof reading has been made to minimize errors.

## 2021-04-27 NOTE — Addendum Note (Signed)
Addended by: Benancio Deeds on: 04/27/2021 03:07 PM   Modules accepted: Orders

## 2021-05-03 ENCOUNTER — Telehealth: Payer: Self-pay | Admitting: Orthopaedic Surgery

## 2021-05-03 NOTE — Telephone Encounter (Signed)
Pt called requesting a call back from [facility] for medical advice about a small wound. Please call pt at [phone].

## 2021-05-03 NOTE — Telephone Encounter (Signed)
Spoke to patient directly and informed him he could use a gauze pad and compression wrap. Also told him if draining continues, to make an appointment to see Dr. Steward Drone

## 2022-03-05 IMAGING — DX DG KNEE COMPLETE 4+V*R*
4 series · 4 of 4 positions shown · non-contrast
Comparison: None.

CLINICAL DATA: Swelling over the last 6 days.

EXAM:
RIGHT KNEE - COMPLETE 4+ VIEW

[knee ap]
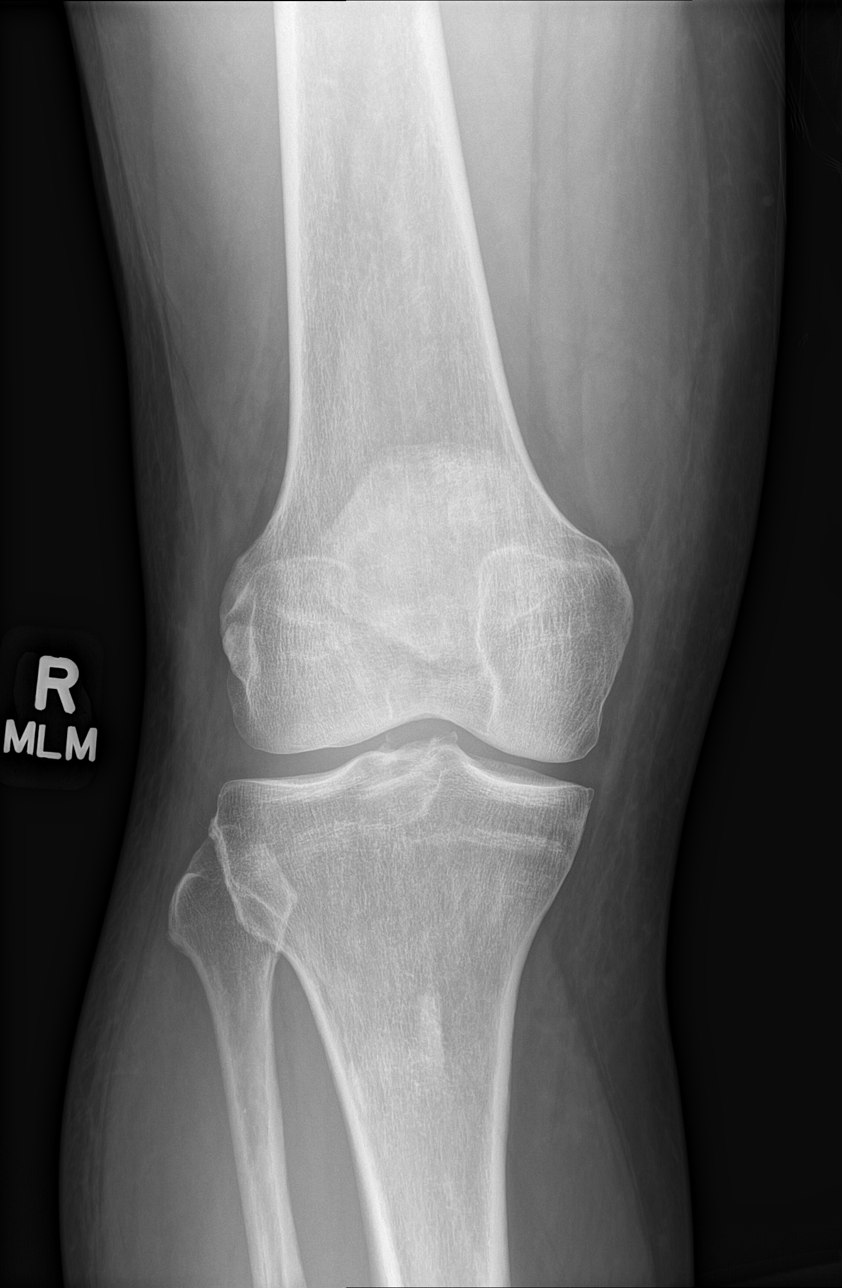

[knee obl (1 of 2)]
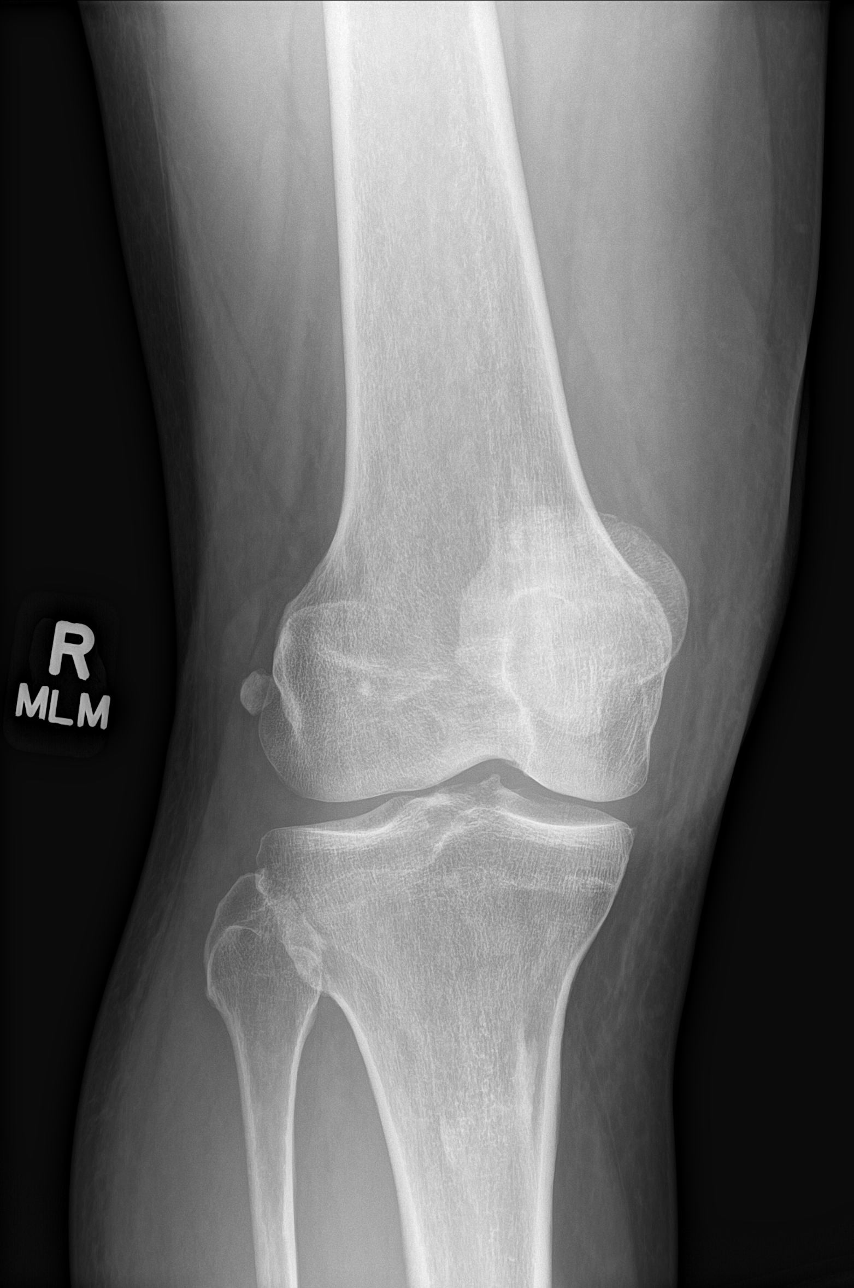

[knee obl (2 of 2)]
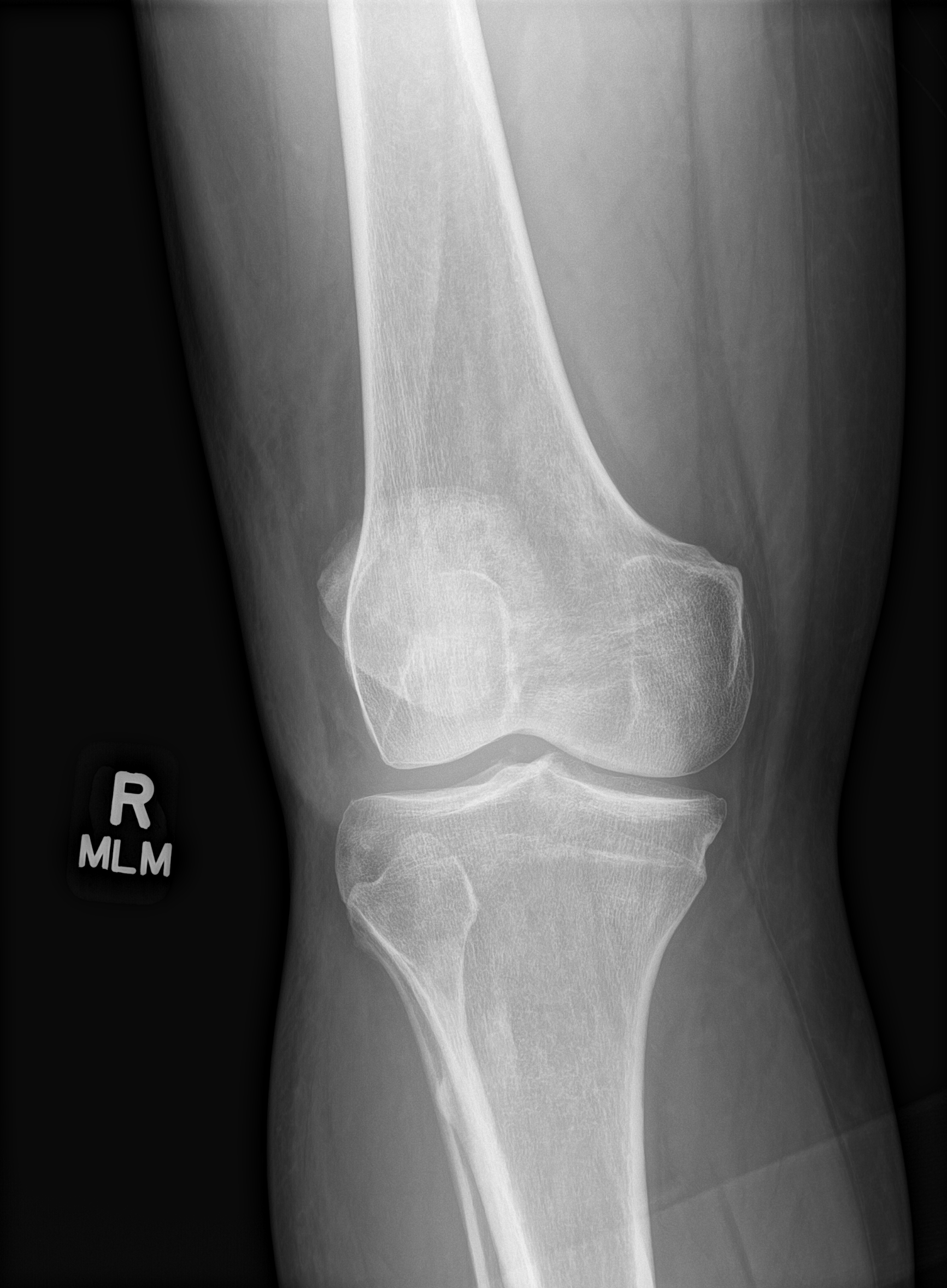

[knee lat]
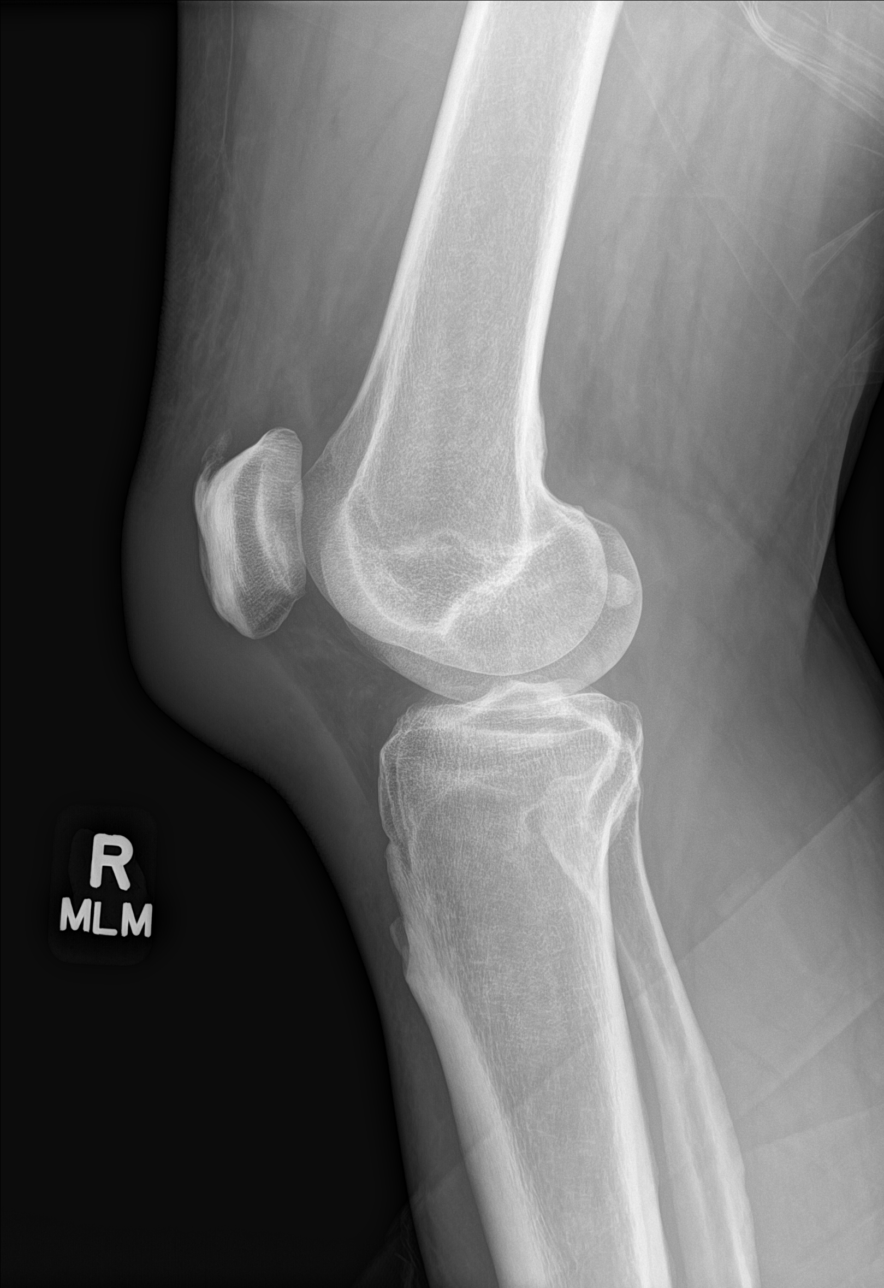

[4 of 4 positions shown; findings below may reference images not displayed]

FINDINGS: Marked prepatellar soft tissue swelling suggesting prepatellar
bursitis. No visible joint effusion. No degenerative joint disease
or focal bone finding.
IMPRESSION: Marked prepatellar soft tissue swelling consistent with prepatellar
bursitis.
